# Patient Record
Sex: Female | Born: 1950 | Race: White | Hispanic: No | Smoking: Never smoker
Health system: Southern US, Community
[De-identification: ages and names within clinical notes are randomized; demographics above are authoritative.]

## PROBLEM LIST (undated history)

## (undated) ENCOUNTER — Emergency Department: Admission: EM | Payer: Medicare PPO

## (undated) DIAGNOSIS — E7800 Pure hypercholesterolemia, unspecified: Secondary | ICD-10-CM

## (undated) DIAGNOSIS — M797 Fibromyalgia: Secondary | ICD-10-CM

## (undated) DIAGNOSIS — M1990 Unspecified osteoarthritis, unspecified site: Secondary | ICD-10-CM

## (undated) DIAGNOSIS — G249 Dystonia, unspecified: Secondary | ICD-10-CM

## (undated) DIAGNOSIS — N289 Disorder of kidney and ureter, unspecified: Secondary | ICD-10-CM

## (undated) DIAGNOSIS — G248 Other dystonia: Secondary | ICD-10-CM

## (undated) DIAGNOSIS — T7840XA Allergy, unspecified, initial encounter: Secondary | ICD-10-CM

## (undated) DIAGNOSIS — G4089 Other seizures: Secondary | ICD-10-CM

## (undated) DIAGNOSIS — E079 Disorder of thyroid, unspecified: Secondary | ICD-10-CM

## (undated) DIAGNOSIS — Z8619 Personal history of other infectious and parasitic diseases: Secondary | ICD-10-CM

## (undated) HISTORY — DX: Dystonia, unspecified: G24.9

## (undated) HISTORY — DX: Disorder of kidney and ureter, unspecified: N28.9

## (undated) HISTORY — DX: Other dystonia: G24.8

## (undated) HISTORY — DX: Allergy, unspecified, initial encounter: T78.40XA

## (undated) HISTORY — DX: Disorder of thyroid, unspecified: E07.9

## (undated) HISTORY — DX: Personal history of other infectious and parasitic diseases: Z86.19

## (undated) HISTORY — PX: COLONOSCOPY: SHX174

## (undated) HISTORY — DX: Unspecified osteoarthritis, unspecified site: M19.90

## (undated) HISTORY — DX: Pure hypercholesterolemia, unspecified: E78.00

## (undated) HISTORY — DX: Fibromyalgia: M79.7

## (undated) HISTORY — DX: Other seizures: G40.89

---
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
## 1990-09-19 HISTORY — PX: LAPAROSCOPY: SHX197

## 1998-05-21 ENCOUNTER — Other Ambulatory Visit: Admission: RE | Admit: 1998-05-21 | Discharge: 1998-05-21 | Payer: Self-pay | Admitting: Obstetrics and Gynecology

## 1999-06-15 ENCOUNTER — Other Ambulatory Visit: Admission: RE | Admit: 1999-06-15 | Discharge: 1999-06-15 | Payer: Self-pay | Admitting: Obstetrics and Gynecology

## 2000-07-17 ENCOUNTER — Other Ambulatory Visit: Admission: RE | Admit: 2000-07-17 | Discharge: 2000-07-17 | Payer: Self-pay | Admitting: Obstetrics and Gynecology

## 2001-08-10 ENCOUNTER — Other Ambulatory Visit: Admission: RE | Admit: 2001-08-10 | Discharge: 2001-08-10 | Payer: Self-pay | Admitting: Obstetrics and Gynecology

## 2002-04-12 ENCOUNTER — Encounter: Payer: Self-pay | Admitting: Family Medicine

## 2002-04-12 ENCOUNTER — Ambulatory Visit (HOSPITAL_COMMUNITY): Admission: RE | Admit: 2002-04-12 | Discharge: 2002-04-12 | Payer: Self-pay | Admitting: Family Medicine

## 2002-08-21 ENCOUNTER — Other Ambulatory Visit: Admission: RE | Admit: 2002-08-21 | Discharge: 2002-08-21 | Payer: Self-pay | Admitting: Obstetrics and Gynecology

## 2003-08-25 ENCOUNTER — Other Ambulatory Visit: Admission: RE | Admit: 2003-08-25 | Discharge: 2003-08-25 | Payer: Self-pay | Admitting: Obstetrics and Gynecology

## 2004-11-01 ENCOUNTER — Other Ambulatory Visit: Admission: RE | Admit: 2004-11-01 | Discharge: 2004-11-01 | Payer: Self-pay | Admitting: Obstetrics and Gynecology

## 2004-11-18 ENCOUNTER — Encounter: Admission: RE | Admit: 2004-11-18 | Discharge: 2004-11-18 | Payer: Self-pay | Admitting: Obstetrics and Gynecology

## 2005-12-19 ENCOUNTER — Encounter: Admission: RE | Admit: 2005-12-19 | Discharge: 2005-12-19 | Payer: Self-pay | Admitting: Obstetrics and Gynecology

## 2006-05-16 ENCOUNTER — Other Ambulatory Visit: Admission: RE | Admit: 2006-05-16 | Discharge: 2006-05-16 | Payer: Self-pay | Admitting: Obstetrics and Gynecology

## 2007-01-03 ENCOUNTER — Encounter: Admission: RE | Admit: 2007-01-03 | Discharge: 2007-01-03 | Payer: Self-pay | Admitting: Family Medicine

## 2007-09-21 ENCOUNTER — Other Ambulatory Visit: Admission: RE | Admit: 2007-09-21 | Discharge: 2007-09-21 | Payer: Self-pay | Admitting: Obstetrics and Gynecology

## 2008-06-19 ENCOUNTER — Encounter: Admission: RE | Admit: 2008-06-19 | Discharge: 2008-06-19 | Payer: Self-pay | Admitting: Obstetrics and Gynecology

## 2009-08-05 ENCOUNTER — Encounter: Admission: RE | Admit: 2009-08-05 | Discharge: 2009-08-05 | Payer: Self-pay | Admitting: Family Medicine

## 2010-08-04 ENCOUNTER — Encounter: Admission: RE | Admit: 2010-08-04 | Discharge: 2010-08-04 | Payer: Self-pay | Admitting: Neurology

## 2010-09-27 ENCOUNTER — Encounter
Admission: RE | Admit: 2010-09-27 | Discharge: 2010-09-27 | Payer: Self-pay | Source: Home / Self Care | Attending: Family Medicine | Admitting: Family Medicine

## 2011-01-19 ENCOUNTER — Other Ambulatory Visit: Payer: Self-pay | Admitting: Neurology

## 2011-01-19 DIAGNOSIS — R269 Unspecified abnormalities of gait and mobility: Secondary | ICD-10-CM

## 2011-01-19 DIAGNOSIS — R259 Unspecified abnormal involuntary movements: Secondary | ICD-10-CM

## 2011-01-19 DIAGNOSIS — R9409 Abnormal results of other function studies of central nervous system: Secondary | ICD-10-CM

## 2011-01-25 ENCOUNTER — Ambulatory Visit
Admission: RE | Admit: 2011-01-25 | Discharge: 2011-01-25 | Disposition: A | Discharge status: Home / Self Care | Payer: BC Managed Care – PPO | Source: Ambulatory Visit | Attending: Neurology | Admitting: Neurology

## 2011-01-25 DIAGNOSIS — R259 Unspecified abnormal involuntary movements: Secondary | ICD-10-CM

## 2011-01-25 DIAGNOSIS — R9409 Abnormal results of other function studies of central nervous system: Secondary | ICD-10-CM

## 2011-01-25 DIAGNOSIS — R269 Unspecified abnormalities of gait and mobility: Secondary | ICD-10-CM

## 2011-10-17 ENCOUNTER — Other Ambulatory Visit: Payer: Self-pay | Admitting: Family Medicine

## 2011-10-17 DIAGNOSIS — Z1231 Encounter for screening mammogram for malignant neoplasm of breast: Secondary | ICD-10-CM

## 2011-12-05 ENCOUNTER — Ambulatory Visit
Admission: RE | Admit: 2011-12-05 | Discharge: 2011-12-05 | Disposition: A | Discharge status: Home / Self Care | Payer: BC Managed Care – PPO | Source: Ambulatory Visit | Attending: Family Medicine | Admitting: Family Medicine

## 2011-12-05 DIAGNOSIS — Z1231 Encounter for screening mammogram for malignant neoplasm of breast: Secondary | ICD-10-CM

## 2011-12-07 ENCOUNTER — Other Ambulatory Visit: Payer: Self-pay | Admitting: Family Medicine

## 2011-12-07 ENCOUNTER — Other Ambulatory Visit (HOSPITAL_COMMUNITY)
Admission: RE | Admit: 2011-12-07 | Discharge: 2011-12-07 | Disposition: A | Discharge status: Home / Self Care | Payer: BC Managed Care – PPO | Source: Ambulatory Visit | Attending: Family Medicine | Admitting: Family Medicine

## 2011-12-07 DIAGNOSIS — R928 Other abnormal and inconclusive findings on diagnostic imaging of breast: Secondary | ICD-10-CM

## 2011-12-07 DIAGNOSIS — Z0000 Encounter for general adult medical examination without abnormal findings: Secondary | ICD-10-CM | POA: Insufficient documentation

## 2011-12-13 ENCOUNTER — Ambulatory Visit
Admission: RE | Admit: 2011-12-13 | Discharge: 2011-12-13 | Disposition: A | Discharge status: Home / Self Care | Payer: BC Managed Care – PPO | Source: Ambulatory Visit | Attending: Family Medicine | Admitting: Family Medicine

## 2011-12-13 DIAGNOSIS — R928 Other abnormal and inconclusive findings on diagnostic imaging of breast: Secondary | ICD-10-CM

## 2012-07-04 DIAGNOSIS — R202 Paresthesia of skin: Secondary | ICD-10-CM | POA: Insufficient documentation

## 2012-07-04 DIAGNOSIS — G248 Other dystonia: Secondary | ICD-10-CM | POA: Insufficient documentation

## 2012-12-26 ENCOUNTER — Other Ambulatory Visit: Payer: Self-pay | Admitting: Family Medicine

## 2012-12-26 DIAGNOSIS — R928 Other abnormal and inconclusive findings on diagnostic imaging of breast: Secondary | ICD-10-CM

## 2013-01-08 ENCOUNTER — Ambulatory Visit
Admission: RE | Admit: 2013-01-08 | Discharge: 2013-01-08 | Disposition: A | Discharge status: Home / Self Care | Payer: Self-pay | Source: Ambulatory Visit | Attending: Family Medicine | Admitting: Family Medicine

## 2013-01-08 DIAGNOSIS — R928 Other abnormal and inconclusive findings on diagnostic imaging of breast: Secondary | ICD-10-CM

## 2013-10-18 ENCOUNTER — Encounter: Payer: Self-pay | Admitting: Internal Medicine

## 2013-12-04 ENCOUNTER — Other Ambulatory Visit: Payer: Self-pay

## 2013-12-04 DIAGNOSIS — Z1231 Encounter for screening mammogram for malignant neoplasm of breast: Secondary | ICD-10-CM

## 2013-12-10 ENCOUNTER — Other Ambulatory Visit: Payer: Self-pay | Admitting: Family Medicine

## 2013-12-10 ENCOUNTER — Encounter: Payer: Self-pay | Admitting: Neurology

## 2013-12-10 DIAGNOSIS — R52 Pain, unspecified: Secondary | ICD-10-CM

## 2013-12-13 ENCOUNTER — Ambulatory Visit
Admission: RE | Admit: 2013-12-13 | Discharge: 2013-12-13 | Disposition: A | Discharge status: Home / Self Care | Payer: BC Managed Care – PPO | Source: Ambulatory Visit | Attending: Family Medicine | Admitting: Family Medicine

## 2013-12-13 ENCOUNTER — Other Ambulatory Visit: Payer: BC Managed Care – PPO

## 2013-12-13 DIAGNOSIS — R52 Pain, unspecified: Secondary | ICD-10-CM

## 2013-12-19 ENCOUNTER — Ambulatory Visit (INDEPENDENT_AMBULATORY_CARE_PROVIDER_SITE_OTHER): Payer: BC Managed Care – PPO | Admitting: Neurology

## 2013-12-19 ENCOUNTER — Encounter: Payer: Self-pay | Admitting: Neurology

## 2013-12-19 VITALS — BP 110/72 | HR 76 | Resp 18 | Ht 64.5 in | Wt 125.0 lb

## 2013-12-19 DIAGNOSIS — M545 Low back pain, unspecified: Secondary | ICD-10-CM

## 2013-12-19 DIAGNOSIS — G249 Dystonia, unspecified: Secondary | ICD-10-CM

## 2013-12-19 DIAGNOSIS — R259 Unspecified abnormal involuntary movements: Secondary | ICD-10-CM

## 2013-12-19 DIAGNOSIS — R292 Abnormal reflex: Secondary | ICD-10-CM

## 2013-12-19 MED ORDER — BACLOFEN 10 MG PO TABS: 20.0000 mg | ORAL_TABLET | Freq: Three times a day (TID) | ORAL | Status: DC

## 2013-12-19 NOTE — Patient Instructions (Addendum)
1.  Baclofen - 10 mg - 1 tablet twice per day for a week, then 1 tablet three times per day for a week and then call me (we will likely need to increase the medication but I just want feedback on if you are having side effects) 2.  Let me know if you wish to talk with a patient representative who has had the dbs for dystonia and we will get you in touch with one. 3.  We have scheduled you at [facility] for your MRI on 01/07/14 at 10:00 am. Please arrive 15 minutes prior and go to 1st floor radiology. If you need to reschedule for any reason please call [phone].

## 2013-12-19 NOTE — Progress Notes (Signed)
Darlene Burns was seen today in neurologic consultation at the request of Allean Found, MD.    The patient presents today for evaluation of focal dystonia.  I reviewed a number of records that were made available to me.  The patient has seen multiple different neurologists including Dr. Anne Hahn, Dr. Terrace Arabia, as well as being seen by movement specialist at [facility] (Dr. Dan Humphreys).  The patient has a diagnosis of focal dystonia of the right ankle.  She also has a diagnosis of primary writing dystonia.  The patient has undergone many therapies for the right ankle dystonia including Botox, physical therapy, electrical stimulation (initially helpful but then seemed to lose efficacy), Sinemet, Artane and clonazepam.  She has also undergone several studies looking for the etiology.  She had an MRI of the brain in November, 2011 as well as May, 2012.  This revealed T2 hyperintensities, one of which was larger than one might expect just for small vessel disease.  This was therefore worked up for demyelinating disease as well as worked up for hypercoagulable states.  Anticardiolipin antibodies were negative, lupus anticoagulant was negative, Sjgren's antibody negative, factor V Leiden negative, rheumatoid factor negative, sedimentation rate to 1, B12 809, ANA negative, ACE was normal.  When I reviewed 2012 MRI brain it just looked like small vessel disease, however.  MRI of the cervical spine was completed on 08/02/2012 that was normal.  Visual evoked potentials were performed at [facility] in November, 2013 that were normal.  She had upper and lower extremity SSEPs that were normal.  Pt reports that the sx's started in may, 2011 and it started with the big toe curing under on the R.  It progressed with the course of time.  She originally went to a foot and ankle specialist and then was referred to [facility].  She had botox almost immediately with Dr. Terrace Arabia and had a bad experience.  She asked Dr. Anne Hahn for another  referral and then was referred to Dr. Dan Humphreys at [facility].  She had a personality conflict and went to [facility].  She liked the physician at [facility] but nothing seemed to help on the long term.  She has been under a significant amount of personal stress.  Her husband has been declared incompetant (has Korsakoff encephalopathy and was in jail) and is now in [place] and she is the one taking care of his affairs and is very stressed.  She is not seeing anyone to help with the stress as she thinks that to go is just "one more stress."  She got a cat 2 weeks ago and some of that has helped but he has bitten her as well.    She really is having trouble walking.  When she puts the put the R foot down, the toes curl.  Recently, she notes that the other foot has paresthesias and are burning.  She lives alone.  She worries that someday, she will not be able to walk at all.    PREVIOUS MEDICATIONS: klonopin, sinemet, artane  ALLERGIES:   Allergies  Allergen Reactions  . Amoxicillin Diarrhea    Profuse diarrhea  . Sulfa Antibiotics Rash    CURRENT MEDICATIONS:  Current Outpatient Prescriptions on File Prior to Visit  Medication Sig Dispense Refill  . calcium citrate-vitamin D (CITRACAL+D) 315-200 MG-UNIT per tablet Take 1 tablet by mouth 2 (two) times daily.      Marland Kitchen levothyroxine (SYNTHROID, LEVOTHROID) 50 MCG tablet Take 50 mcg by mouth daily before breakfast.       .  Multiple Vitamins tablet Take 1 tablet by mouth daily.       . simvastatin (ZOCOR) 40 MG tablet Take 40 mg by mouth daily at 6 PM.       . traZODone (DESYREL) 100 MG tablet Take 100 mg by mouth at bedtime.        No current facility-administered medications on file prior to visit.    PAST MEDICAL HISTORY:   Past Medical History  Diagnosis Date  . Thyroid disorder   . Hypercholesteremia   . Dystonia   . Fibromyalgia   . History of shingles     PAST SURGICAL HISTORY:   Past Surgical History  Procedure Laterality Date  . Laparoscopy  1992   . Tonsillectomy  1956    SOCIAL HISTORY:   History   Social History  . Marital Status: Married    Spouse Name: N/A    Number of Children: N/A  . Years of Education: N/A   Occupational History  . Not on file.   Social History Main Topics  . Smoking status: Never Smoker   . Smokeless tobacco: Not on file  . Alcohol Use: No  . Drug Use: No  . Sexual Activity: Not on file   Other Topics Concern  . Not on file   Social History Narrative  . No narrative on file    FAMILY HISTORY:   Family Status  Relation Status Death Age  . Father Deceased 69    lung cancer, diabetes, MI, CAD  . Mother Deceased 60    brain tumor, hyperlipidemia  . Sister Alive     healthy    ROS:  Some blurry vision.  A complete 10 system review of systems was obtained and was unremarkable apart from what is mentioned above.  PHYSICAL EXAMINATION:    VITALS:   Filed Vitals:   12/19/13 0901  BP: 110/72  Pulse: 76  Resp: 18  Height: 5' 4.5" (1.638 m)  Weight: 125 lb (56.7 kg)    GEN:  Normal appears female in no acute distress.  Appears stated age. HEENT:  Normocephalic, atraumatic. The mucous membranes are moist. The superficial temporal arteries are without ropiness or tenderness. Cardiovascular: Regular rate and rhythm. Lungs: Clear to auscultation bilaterally. Neck/Heme: There are no carotid bruits noted bilaterally.  NEUROLOGICAL: Orientation:  The patient is alert and oriented x 3.  Fund of knowledge is appropriate.  Recent and remote memory intact.  Attention span and concentration normal.  Repeats and names without difficulty. Cranial nerves: There is good facial symmetry. The pupils are equal round and reactive to light bilaterally. Fundoscopic exam reveals clear disc margins bilaterally. Extraocular muscles are intact and visual fields are full to confrontational testing. Speech is fluent and clear. Soft palate rises symmetrically and there is no tongue deviation. Hearing is intact  to conversational tone. Tone: Tone is good throughout. Sensation: Sensation is intact to light touch and pinprick throughout (facial, trunk, extremities). Vibration is intact at the bilateral big toe. There is no extinction with double simultaneous stimulation. There is no sensory dermatomal level identified. Coordination:  The patient has no difficulty with RAM's or FNF bilaterally. Motor: Strength is 5/5 in the bilateral upper and lower extremities.  Shoulder shrug is equal and symmetric. There is no pronator drift.  There are no fasciculations noted.  The toes are curled on the R foot and the ankle is inverted DTR's: Deep tendon reflexes are 3-/4 at the bilateral biceps, triceps, brachioradialis, patella and achilles.  Plantar  responses are downgoing bilaterally. Gait and Station: The patient is able to ambulate.  She does have a steppage gait on the R and does not have a heel strike.     IMPRESSION/PLAN  1. R foot dystonia.  -She has undergone a number of treatments and extensive workup.  Given the hyperreflexia, I would like to perform an MRI of the lumbar spine.  She was agreeable.  -We talked about other medications, although medications options are limited.  There is some evidence for Dilantin, Tegretol and baclofen.  We ultimately decided on baclofen.  She will begin with 10 mg twice a day, working up to 10 mg 3 times a day.  Ultimately, the goal is to get up to 20 mg 3 times a day, without causing side effects.  Risks, benefits, side effects and alternative therapies were discussed.  The opportunity to ask questions was given and they were answered to the best of my ability.  The patient expressed understanding and willingness to follow the outlined treatment protocols.  -We talked extensively about DBS for dystonia.  We talked about the risks and benefits.  We talked about the fact that results are not immediate, and  can take months to even a year to show benefit.  She was under the  impression that if she had the surgery, the effects would be immediate and she would walk out of the hospital a new person.  She read an article that a nurse had written that talked about that.  I told her that was not factual for the treatment of dystonia.  I did give her a patient education DVD on dystonia.  I offered to have her talk to a patient representative who has had the surgery.  She is going to think about that.  -She asked me about a denervation surgery and I was not in favor.  She stated that her [facility] physician told her the same thing.  -Greater than 50% of 80 min visit in counseling discussing tx options, safety, coordinating care.  Does not include time for record review which was extensive.    Thank you for your referral.  Please feel free to call me with questions or concerns.

## 2013-12-26 ENCOUNTER — Telehealth: Payer: Self-pay | Admitting: Neurology

## 2013-12-26 NOTE — Telephone Encounter (Signed)
Has some questions and needs to talk to someone [phone]

## 2013-12-26 NOTE — Telephone Encounter (Signed)
Patient wanted to correct some mistakes in her chart. She only takes 1/2 tablet of Trazodone at night and she started an aspirin 81 mg daily. These medication changes were corrected. Patient also questioned why her primary diagnosis for her visit was back pain when she was primarily seen for dystonia. Made aware it was probably typed first and associated with the reason for her MR evaluation. Patient made aware dystonia is on her visit diagnosis. She will call with any other questions.

## 2014-01-07 ENCOUNTER — Ambulatory Visit (HOSPITAL_COMMUNITY)
Admission: RE | Admit: 2014-01-07 | Discharge: 2014-01-07 | Disposition: A | Discharge status: Home / Self Care | Payer: BC Managed Care – PPO | Source: Ambulatory Visit | Attending: Neurology | Admitting: Neurology

## 2014-01-07 DIAGNOSIS — M51379 Other intervertebral disc degeneration, lumbosacral region without mention of lumbar back pain or lower extremity pain: Secondary | ICD-10-CM | POA: Insufficient documentation

## 2014-01-07 DIAGNOSIS — M545 Low back pain, unspecified: Secondary | ICD-10-CM

## 2014-01-07 DIAGNOSIS — M5126 Other intervertebral disc displacement, lumbar region: Secondary | ICD-10-CM | POA: Insufficient documentation

## 2014-01-07 DIAGNOSIS — G249 Dystonia, unspecified: Secondary | ICD-10-CM

## 2014-01-07 DIAGNOSIS — M5137 Other intervertebral disc degeneration, lumbosacral region: Secondary | ICD-10-CM | POA: Insufficient documentation

## 2014-01-07 DIAGNOSIS — R209 Unspecified disturbances of skin sensation: Secondary | ICD-10-CM | POA: Insufficient documentation

## 2014-01-07 DIAGNOSIS — R262 Difficulty in walking, not elsewhere classified: Secondary | ICD-10-CM | POA: Insufficient documentation

## 2014-01-09 ENCOUNTER — Encounter (INDEPENDENT_AMBULATORY_CARE_PROVIDER_SITE_OTHER): Payer: Self-pay

## 2014-01-09 ENCOUNTER — Ambulatory Visit
Admission: RE | Admit: 2014-01-09 | Discharge: 2014-01-09 | Disposition: A | Discharge status: Home / Self Care | Payer: BC Managed Care – PPO | Source: Ambulatory Visit

## 2014-01-09 DIAGNOSIS — Z1231 Encounter for screening mammogram for malignant neoplasm of breast: Secondary | ICD-10-CM

## 2014-01-10 ENCOUNTER — Telehealth: Payer: Self-pay | Admitting: Neurology

## 2014-01-10 NOTE — Telephone Encounter (Signed)
Spoke with patient and made her aware no findings for the cause for dystonia. Patient requested a copy be mailed to her and that has been done. She has been on the baclofen for two weeks. She is currently taking 10 mg TID. She states she feels worse. She is having more trouble walking and having more pain. Please advise.

## 2014-01-10 NOTE — Telephone Encounter (Signed)
She can stop it if it is making things worse.  I did not get the feeling that she was interested in DBS therapy given the length of time it can take to work.  Is that correct?

## 2014-01-10 NOTE — Telephone Encounter (Signed)
Message copied by Silvio PateMCCRACKEN, JADE L on Fri Jan 10, 2014  8:07 AM ------      Message from: TAT, REBECCA S      Created: Thu Jan 09, 2014  4:36 PM       Yes, degenerative changes but nothing causing the dystonia      ----- Message -----         From: Silvio PateJade L McCracken, CMA         Sent: 01/09/2014   9:56 AM           To: Octaviano Battyebecca S Tat, DO            Have you looked at her MR yet?       ------

## 2014-01-10 NOTE — Telephone Encounter (Signed)
Spoke with patient and she will stop Baclofen. She wants to know if we have any other medication suggestions. She is interested in DBS, but feels like she needs more information. She would be interested in speaking with a patient who has had it before - she states she had spoken with you about this at her visit. Please advise.

## 2014-01-10 NOTE — Telephone Encounter (Signed)
Spoke with patient and she will hold off on the other medications for now. I spoke with Caryn BeeKevin who will look into a patient rep for dystonia and get back to me.

## 2014-01-10 NOTE — Telephone Encounter (Signed)
There is some limited evidence that some of the older seizure meds can help dystonia.  Let me know if she wants to try.  Ask kevin or dusty to hook patient up with a pt rep for DBS re: dystonia.

## 2014-03-20 ENCOUNTER — Encounter: Payer: Self-pay | Admitting: Neurology

## 2014-03-20 ENCOUNTER — Ambulatory Visit (INDEPENDENT_AMBULATORY_CARE_PROVIDER_SITE_OTHER): Payer: BC Managed Care – PPO | Admitting: Neurology

## 2014-03-20 ENCOUNTER — Telehealth: Payer: Self-pay | Admitting: Neurology

## 2014-03-20 VITALS — BP 100/72 | HR 79 | Ht 64.5 in | Wt 126.0 lb

## 2014-03-20 DIAGNOSIS — G2589 Other specified extrapyramidal and movement disorders: Secondary | ICD-10-CM

## 2014-03-20 DIAGNOSIS — R259 Unspecified abnormal involuntary movements: Secondary | ICD-10-CM

## 2014-03-20 DIAGNOSIS — G248 Other dystonia: Secondary | ICD-10-CM

## 2014-03-20 DIAGNOSIS — G249 Dystonia, unspecified: Secondary | ICD-10-CM

## 2014-03-20 DIAGNOSIS — Z5181 Encounter for therapeutic drug level monitoring: Secondary | ICD-10-CM

## 2014-03-20 DIAGNOSIS — H532 Diplopia: Secondary | ICD-10-CM

## 2014-03-20 MED ORDER — OXCARBAZEPINE 150 MG PO TABS: ORAL_TABLET | ORAL | Status: DC

## 2014-03-20 NOTE — Telephone Encounter (Signed)
Message copied by Silvio PateMCCRACKEN, Lani Havlik L on Thu Mar 20, 2014  3:05 PM ------      Message from: TAT, REBECCA S      Created: Thu Mar 20, 2014 12:10 PM       Please call kevin. Pt wants to talk with pt rep who specifically has had DBS for a focal, not generalized, dystonia.  Is that an option that he can help with ? ------

## 2014-03-20 NOTE — Patient Instructions (Signed)
1. Your medication has been sent to your pharmacy. Trileptal 150 mg tablets. Please take one tablet by mouth twice daily for once week and then two tablets twice daily after. 2. Your provider has requested that you have labwork completed. [facility] on the first floor of this building in approximately two weeks. 3. We have sent a referral to Dr Raquel SarnaNina Browner at [facility]. They will contact you directly with an appt. If you do not hear from them they can be reached at [phone]. 4. Follow up after your appt with Dr Seward MethBrowner.

## 2014-03-20 NOTE — Progress Notes (Signed)
Darlene Burns was seen today in neurologic consultation at the request of Allean FoundSMITH,CANDACE THIELE, MD.    The patient presents today for evaluation of focal dystonia.  I reviewed a number of records that were made available to me.  The patient has seen multiple different neurologists including Dr. Anne HahnWillis, Dr. Terrace ArabiaYan, as well as being seen by movement specialist at [facility] (Dr. Dan HumphreysWalker).  The patient has a diagnosis of focal dystonia of the right ankle.  She also has a diagnosis of primary writing dystonia.  The patient has undergone many therapies for the right ankle dystonia including Botox, physical therapy, electrical stimulation (initially helpful but then seemed to lose efficacy), Sinemet, Artane and clonazepam.  She has also undergone several studies looking for the etiology.  She had an MRI of the brain in November, 2011 as well as May, 2012.  This revealed T2 hyperintensities, one of which was larger than one might expect just for small vessel disease.  This was therefore worked up for demyelinating disease as well as worked up for hypercoagulable states.  Anticardiolipin antibodies were negative, lupus anticoagulant was negative, Sjgren's antibody negative, factor V Leiden negative, rheumatoid factor negative, sedimentation rate to 1, B12 809, ANA negative, ACE was normal.  When I reviewed 2012 MRI brain it just looked like small vessel disease, however.  MRI of the cervical spine was completed on 08/02/2012 that was normal.  [facility] were [facility] at [facility] in November, 2013 that were normal.  She had upper and lower extremity SSEPs that were normal.  Pt reports that the sx's started in may, 2011 and it started with the big toe curing under on the R.  It progressed with the course of time.  She originally went to a foot and ankle specialist and then was referred to [facility].  She had botox almost immediately with Dr. Terrace ArabiaYan and had a bad experience.  She asked Dr. Anne HahnWillis for another  referral and then was referred to [facility].  She had a personality conflict and went to [facility].  She liked the physician at [facility] but nothing seemed to help on the long term.  She has been under a significant amount of personal stress.  Her husband has been declared incompetant (has Korsakoff encephalopathy and was in jail) and is now in [place] and she is the one taking care of his affairs and is very stressed.  She is not seeing anyone to help with the stress as she thinks that to go is just "one more stress."  She got a cat 2 weeks ago and some of that has helped but he has bitten her as well.    She really is having trouble walking.  When she puts the put the R foot down, the toes curl.  Recently, she notes that the other foot has paresthesias and are burning.  She lives alone.  She worries that someday, she will not be able to walk at all.    03/20/14 update:  The patient is following up today regarding her right foot dystonia.  Last visit, I started her on baclofen.  She got up to 10 mg twice a day and called and said that it made her worse.  She did not want to titrate up further.  She did not want to try any other medications at the time.  She was given the patient DVT on dystonia.  She did request to talk to a patient representative who has had  DBS for dystonia.  I talked to the [employer] rep and requested that he put her in contact with such a patient, but unfortunately this was not done.  She has read some literature on DBS therapy and has looked into some trials, but is frustrated because there are no trials related to focal dystonia, but rather more generalized dystonia.  She is willing to try other medications.  She brings in a calendar today and shows me a few days where she actually had the ability to walk in the morning for 20 or 30 minutes.  She states that if she walks barefoot it is better, but the minute she tries to put on a shoe, she will have a dystonic posturing of the foot.   Today, she complains about something new.  She complains about diplopia.  She states that it has been intermittently going on for about 2 years.  It is vertical in nature.  She is unsure if it is monocular.  It seems worse in the morning, but she is not sure of that.  There is no ptosis.  She has seen the eye doctor, but no diagnosis was given.  No shortness of breath.  No difficulty climbing stairs, with the exception of the dystonic posturing of the foot.  The patient did have an MRI of the lumbar spine since last visit because of hyperreflexia and because of the foot dystonia.  There were some degenerative changes and facet hypertrophy at several levels, but no evidence of high-grade stenosis or anything that would contribute to this dystonia.  PREVIOUS MEDICATIONS: klonopin, sinemet, artane  ALLERGIES:   Allergies  Allergen Reactions  . Amoxicillin Diarrhea    Profuse diarrhea  . Sulfa Antibiotics Rash    CURRENT MEDICATIONS:  Current Outpatient Prescriptions on File Prior to Visit  Medication Sig Dispense Refill  . aspirin 81 MG tablet Take 81 mg by mouth daily.      . calcium citrate-vitamin D (CITRACAL+D) 315-200 MG-UNIT per tablet Take 1 tablet by mouth 2 (two) times daily.      Marland Kitchen. levothyroxine (SYNTHROID, LEVOTHROID) 50 MCG tablet Take 50 mcg by mouth daily before breakfast.       . Multiple Vitamins tablet Take 1 tablet by mouth daily.       . simvastatin (ZOCOR) 40 MG tablet Take 40 mg by mouth daily at 6 PM.       . traZODone (DESYREL) 100 MG tablet Take 50 mg by mouth at bedtime.        No current facility-administered medications on file prior to visit.    PAST MEDICAL HISTORY:   Past Medical History  Diagnosis Date  . Thyroid disorder   . Hypercholesteremia   . Dystonia   . Fibromyalgia   . History of shingles     PAST SURGICAL HISTORY:   Past Surgical History  Procedure Laterality Date  . Laparoscopy  1992  . Tonsillectomy  1956    SOCIAL HISTORY:     History   Social History  . Marital Status: Married    Spouse Name: N/A    Number of Children: N/A  . Years of Education: N/A   Occupational History  .      school librarian - retired   Social History Main Topics  . Smoking status: Never Smoker   . Smokeless tobacco: Not on file  . Alcohol Use: No  . Drug Use: No  . Sexual Activity: Not on file   Other Topics Concern  .  Not on file   Social History Narrative  . No narrative on file    FAMILY HISTORY:   Family Status  Relation Status Death Age  . Father Deceased 62    lung cancer, diabetes, MI, CAD  . Mother Deceased 45    ? meningioma, hyperlipidemia  . Sister Alive     healthy    ROS:   A complete 10 system review of systems was obtained and was unremarkable apart from what is mentioned above.  PHYSICAL EXAMINATION:    VITALS:   Filed Vitals:   03/20/14 0830  BP: 100/72  Pulse: 79  Height: 5' 4.5" (1.638 m)  Weight: 126 lb (57.153 kg)    GEN:  Normal appears female in no acute distress.  Appears stated age. HEENT:  Normocephalic, atraumatic. The mucous membranes are moist. The superficial temporal arteries are without ropiness or tenderness. Cardiovascular: Regular rate and rhythm. Lungs: Clear to auscultation bilaterally. Neck/Heme: There are no carotid bruits noted bilaterally.  NEUROLOGICAL: Orientation:  The patient is alert and oriented x 3.  Fund of knowledge is appropriate.  Recent and remote memory intact.  Attention span and concentration normal.  Repeats and names without difficulty. Cranial nerves: There is good facial symmetry. The pupils are equal round and reactive to light bilaterally. Fundoscopic exam reveals clear disc margins bilaterally. Extraocular muscles are intact and visual fields are full to confrontational testing. Speech is fluent and clear. Soft palate rises symmetrically and there is no tongue deviation. Hearing is intact to conversational tone. Tone: Tone is good  throughout. Sensation: Sensation is intact to light touch throughout. Coordination:  The patient has no difficulty with RAM's or FNF bilaterally. Motor: Strength is 5/5 in the bilateral upper and lower extremities.  Shoulder shrug is equal and symmetric. There is no pronator drift.  There are no fasciculations noted.  The toes are curled on the R foot and the ankle is inverted DTR's: Deep tendon reflexes are 3-/4 at the bilateral biceps, triceps, brachioradialis, patella and achilles.  Plantar responses are downgoing bilaterally. Gait and Station: The patient is able to ambulate.  She does have a steppage gait on the R and does not have a heel strike.  She is wearing an AFO on the right.   IMPRESSION/PLAN  1. R foot dystonia.  -She has undergone a number of treatments and extensive workup.    -We talked about other medications, although medications options are limited.  There is some evidence for Dilantin, Tegretol.  We decided to try Trileptal.  Risks, benefits, side effects and alternative therapies were discussed.  The opportunity to ask questions was given and they were answered to the best of my ability.  The patient expressed understanding and willingness to follow the outlined treatment protocols.  She will have a BMP in 2 weeks.  -She would like an opinion with Dr. Seward Meth, and I will refer her.  -Will try to put her in touch with a patient representative that has had DBS for dystonia, at her request. 2.  diplopia, fluctuating intermittent.  -We'll draw acetylcholine receptor antibodies.

## 2014-03-20 NOTE — Telephone Encounter (Signed)
Spoke with Caryn BeeKevin. He will look into it and get back to me on Monday.

## 2014-03-24 NOTE — Telephone Encounter (Signed)
Caryn BeeKevin sent me an information packet with contact information for DBS representatives. Mailed to patient.

## 2014-04-01 ENCOUNTER — Telehealth: Payer: Self-pay | Admitting: Neurology

## 2014-04-01 NOTE — Telephone Encounter (Signed)
Spoke with [facility], Dr Estrellita LudwigNina Browner's office and they received referral - it is still in review and they will call patient to schedule once this has been approved.

## 2014-04-08 LAB — BASIC METABOLIC PANEL
BUN: 16 mg/dL (ref 6–23)
CHLORIDE: 102 meq/L (ref 96–112)
CO2: 30 mEq/L (ref 19–32)
Calcium: 9.5 mg/dL (ref 8.4–10.5)
Creat: 0.75 mg/dL (ref 0.50–1.10)
GLUCOSE: 84 mg/dL (ref 70–99)
POTASSIUM: 4.7 meq/L (ref 3.5–5.3)
SODIUM: 140 meq/L (ref 135–145)

## 2014-04-13 LAB — MYASTHENIA GRAVIS PANEL 2: Acetylcholine Rec Mod Ab: 11 %

## 2014-04-14 ENCOUNTER — Encounter: Payer: Self-pay | Admitting: Neurology

## 2014-04-16 ENCOUNTER — Telehealth: Payer: Self-pay | Admitting: Neurology

## 2014-04-16 MED ORDER — OXCARBAZEPINE 150 MG PO TABS
ORAL_TABLET | ORAL | Status: DC
Start: 2014-04-16 — End: 2016-02-24

## 2014-04-16 NOTE — Telephone Encounter (Signed)
Patient made aware of Dr Don Perkingat's message. Labs normal. She will increase Trileptal to 600 mg in the AM 300 mg at night. New RX sent to pharmacy. She does not have an appt with [facility] yet- she has to call early morning on 05/01/2014 to get appt.

## 2014-04-16 NOTE — Telephone Encounter (Signed)
Pt called for lab results, this apparently did not go through. She has recently checked MyChart and did not see a message from Dr. Arbutus Leasat. Also has a question about continuing meds. CB# [phone] / Sherri S>

## 2014-04-21 ENCOUNTER — Telehealth: Payer: Self-pay | Admitting: Neurology

## 2014-04-21 NOTE — Telephone Encounter (Signed)
Patient made aware. She will let me know how she does with this change.

## 2014-04-21 NOTE — Telephone Encounter (Signed)
Patient increased Trileptal to 600 mg in the AM, 300 mg in the PM. She states for the past two days after taking 600 mg she gets very dizzy for about 3 1/5 hours. She wants to know if she should stick this out and her body will get used to it or whether she should decrease dosage again? She took only 450 mg this AM and she states she is not nearly as dizzy. Please advise.

## 2014-04-21 NOTE — Telephone Encounter (Signed)
Pt called stating that she is having side effects such as dizziness from taking TRILEPTAL 150mg . She wants to know if she needs to stop taking it all together or reduce the dosage.  C/B  [phone]

## 2014-04-21 NOTE — Telephone Encounter (Signed)
She can try Trileptal 450mg  in the AM and 300mg  in the PM x 1 week, then try to increase to 600mg  in AM and continue 300mg  in evening, to see if that helps with dizziness.  Maille Halliwell K. Allena KatzPatel, DO

## 2014-05-02 ENCOUNTER — Telehealth: Payer: Self-pay | Admitting: Neurology

## 2014-05-02 NOTE — Telephone Encounter (Signed)
If its causing this many problems and not helping sx's then just stop the medication.  I [facility] that [facility] with Dr. Seward MethBrowner.

## 2014-05-02 NOTE — Telephone Encounter (Signed)
Pt is taking medication and she states that she feels like she is about to pass out please call [phone]

## 2014-05-02 NOTE — Telephone Encounter (Signed)
Called patient back about Trileptal. She went down to 450 mg in the AM, 300 mg PM for one week and then increased back to 600 AM, 300 PM. She states she feels very sleepy and dizzy. She felt this somewhat with the lower dose but it is worse on the 600 mg AM dose. This morning when she took the medication she felt like she was going to pass out for 10 minutes and almost called 911. She is okay now, just sleepy and dizzy. Please advise.

## 2014-05-02 NOTE — Telephone Encounter (Signed)
Patient made aware to stop medication. She will decrease to 300 AM, 300 PM for 2 days and then stop. Her appt with Dr Seward MethBrowner is at the beginning of September. She will keep appt.

## 2014-05-12 ENCOUNTER — Encounter: Payer: Self-pay | Admitting: Neurology

## 2014-06-16 NOTE — Telephone Encounter (Signed)
error 

## 2014-07-03 ENCOUNTER — Encounter: Payer: Self-pay | Admitting: Internal Medicine

## 2014-11-19 ENCOUNTER — Other Ambulatory Visit: Payer: Self-pay | Admitting: Family Medicine

## 2014-11-19 DIAGNOSIS — E041 Nontoxic single thyroid nodule: Secondary | ICD-10-CM

## 2014-11-26 ENCOUNTER — Ambulatory Visit
Admission: RE | Admit: 2014-11-26 | Discharge: 2014-11-26 | Disposition: A | Discharge status: Home / Self Care | Payer: BC Managed Care – PPO | Source: Ambulatory Visit | Attending: Family Medicine | Admitting: Family Medicine

## 2014-11-26 DIAGNOSIS — E041 Nontoxic single thyroid nodule: Secondary | ICD-10-CM

## 2015-01-15 ENCOUNTER — Other Ambulatory Visit: Payer: Self-pay

## 2015-01-15 DIAGNOSIS — Z1231 Encounter for screening mammogram for malignant neoplasm of breast: Secondary | ICD-10-CM

## 2015-01-23 ENCOUNTER — Ambulatory Visit
Admission: RE | Admit: 2015-01-23 | Discharge: 2015-01-23 | Disposition: A | Discharge status: Home / Self Care | Payer: BC Managed Care – PPO | Source: Ambulatory Visit

## 2015-01-23 DIAGNOSIS — Z1231 Encounter for screening mammogram for malignant neoplasm of breast: Secondary | ICD-10-CM

## 2015-11-30 ENCOUNTER — Other Ambulatory Visit: Payer: Self-pay | Admitting: Family Medicine

## 2015-11-30 ENCOUNTER — Other Ambulatory Visit (HOSPITAL_COMMUNITY)
Admission: RE | Admit: 2015-11-30 | Discharge: 2015-11-30 | Disposition: A | Discharge status: Home / Self Care | Payer: Medicare Other | Source: Ambulatory Visit | Attending: Family Medicine | Admitting: Family Medicine

## 2015-11-30 DIAGNOSIS — Z124 Encounter for screening for malignant neoplasm of cervix: Secondary | ICD-10-CM | POA: Insufficient documentation

## 2015-12-01 LAB — CYTOLOGY - PAP

## 2015-12-24 ENCOUNTER — Other Ambulatory Visit: Payer: Self-pay

## 2015-12-24 DIAGNOSIS — Z1231 Encounter for screening mammogram for malignant neoplasm of breast: Secondary | ICD-10-CM

## 2016-01-11 ENCOUNTER — Encounter: Payer: Self-pay | Admitting: Gastroenterology

## 2016-01-27 ENCOUNTER — Ambulatory Visit
Admission: RE | Admit: 2016-01-27 | Discharge: 2016-01-27 | Disposition: A | Discharge status: Home / Self Care | Payer: Medicare Other | Source: Ambulatory Visit

## 2016-01-27 DIAGNOSIS — Z1231 Encounter for screening mammogram for malignant neoplasm of breast: Secondary | ICD-10-CM

## 2016-02-08 ENCOUNTER — Encounter: Payer: Self-pay | Admitting: Podiatry

## 2016-02-08 ENCOUNTER — Ambulatory Visit (INDEPENDENT_AMBULATORY_CARE_PROVIDER_SITE_OTHER): Payer: Medicare Other | Admitting: Podiatry

## 2016-02-08 VITALS — BP 134/86 | HR 80 | Resp 18

## 2016-02-08 DIAGNOSIS — G248 Other dystonia: Secondary | ICD-10-CM | POA: Diagnosis not present

## 2016-02-08 DIAGNOSIS — M2450 Contracture, unspecified joint: Secondary | ICD-10-CM

## 2016-02-08 DIAGNOSIS — R269 Unspecified abnormalities of gait and mobility: Secondary | ICD-10-CM

## 2016-02-08 NOTE — Progress Notes (Signed)
   Subjective:    Patient ID: Darlene Burns, female    DOB: 1951-03-23, 65 y.o.   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 She states that she has had multiple issues with her right foot and leg this started. She has seen multiple physicians including [facility] at [facility], [facility], [facility] as well as neurology a [facility]. She's tried therapy but she has not tried bracing. She has had Botox injections to her right leg which helps some. She is also tried [facility] as well as physical therapy. She's had multiple treatments without much significant improvement.  Review of Systems  All other systems reviewed and are negative.      Objective:   Physical Exam General: AAO x3, NAD  Dermatological: Mild incurvation of the right hallux toenail without any surrounding redness or drainage. No tenderness. No open lesions.  Vascular: Dorsalis Pedis artery and Posterior Tibial artery pedal pulses are 2/4 bilateral with immedate capillary fill time. Pedal hair growth present. No varicosities and no lower extremity edema present bilateral. There is no pain with calf compression, swelling, warmth, erythema.   Neruologic: Hyperreflexia of the right Achilles reflex. Sensation appears to be intact with Dorann OuSimms Weinstein monofilament  Musculoskeletal: Muscle contractures present on the right foot and hallux is a plantar flexed position. Upon gait evaluation of the forefoot is adducted with a layer foot varus and her leg and the genu varum on the right side. She gave me a diagram today of where she does get pain appears to be the right hallux, right lateral foot/rear foot. No area pinpoint tenderness at this time. The majority of her symptoms appear to be with ambulation.  Gait: Unassisted, Nonantalgic.     Assessment & Plan:  65 year old female with right focal dystonia, gait changes as a result. -Treatment options discussed including all  alternatives, risks, and complications -Etiology of symptoms were discussed -I long discussion the patient regard to treatment options. At this point she has seen multiple physicians for this as well as physical therapy, e-stim, Botox injections. I discussed with her bracing to see if this will help but she has not yet done this. Concerned that given her distally this could cause irritation and the brace we may be other do a brace for supportive but with good cushion to the area to help prevent this. [facility] to see if she would even be a good candidate for a brace.  -I discussed partial nail avulsion but she wishes to hold off on this. Discussed nail trimming techniques. Monitor for signs or symptoms of infection. He is going to a wedding in a couple weeks and she wishes to hold off for now.  Ovid CurdMatthew Wagoner, DPM

## 2016-02-22 ENCOUNTER — Ambulatory Visit: Payer: Medicare Other | Admitting: *Deleted

## 2016-02-22 DIAGNOSIS — R269 Unspecified abnormalities of gait and mobility: Secondary | ICD-10-CM

## 2016-02-22 NOTE — Progress Notes (Signed)
Patient ID: Darlene Burns, female   DOB: 02/25/1951, 64 y.o.   MRN: [id number] Patient presents to be casted for a brace with [facility] Certified Pedorthist.  Patient will return in 4 weeks to be fitted. 

## 2016-02-24 ENCOUNTER — Ambulatory Visit (AMBULATORY_SURGERY_CENTER): Payer: Self-pay | Admitting: *Deleted

## 2016-02-24 VITALS — Ht 63.5 in | Wt 131.0 lb

## 2016-02-24 DIAGNOSIS — Z1211 Encounter for screening for malignant neoplasm of colon: Secondary | ICD-10-CM

## 2016-02-24 NOTE — Progress Notes (Signed)
No egg or soy allergy known to patient  No issues with past sedation with any surgeries  or procedures, no intubation problems  No diet pills per patient No home 02 use per patient  No blood thinners per patient  Pt denies issues with constipation  emmi video to e mail  

## 2016-02-25 ENCOUNTER — Encounter: Payer: Self-pay | Admitting: Gastroenterology

## 2016-03-09 ENCOUNTER — Ambulatory Visit (AMBULATORY_SURGERY_CENTER): Payer: Medicare Other | Admitting: Gastroenterology

## 2016-03-09 ENCOUNTER — Encounter: Payer: Self-pay | Admitting: Gastroenterology

## 2016-03-09 VITALS — BP 124/73 | HR 72 | Temp 98.6°F | Resp 12 | Ht 63.5 in | Wt 131.0 lb

## 2016-03-09 DIAGNOSIS — Z1211 Encounter for screening for malignant neoplasm of colon: Secondary | ICD-10-CM | POA: Diagnosis present

## 2016-03-09 MED ORDER — SODIUM CHLORIDE 0.9 % IV SOLN: 500.0000 mL | INTRAVENOUS | Status: DC

## 2016-03-09 NOTE — Progress Notes (Signed)
To recovery, report to Ennis, RN, VSS 

## 2016-03-09 NOTE — Patient Instructions (Signed)
Impression/Recommendations:  Diverticulosis (handout given) High Fiber Diet (handout given)  YOU HAD AN ENDOSCOPIC PROCEDURE TODAY AT THE [facility]:   Refer to the procedure report that was given to you for any specific questions about what was found during the examination.  If the procedure report does not answer your questions, please call your gastroenterologist to clarify.  If you requested that your care partner not be given the details of your procedure findings, then the procedure report has been included in a sealed envelope for you to review at your convenience later.  YOU SHOULD EXPECT: Some feelings of bloating in the abdomen. Passage of more gas than usual.  Walking can help get rid of the air that was put into your GI tract during the procedure and reduce the bloating. If you had a lower endoscopy (such as a colonoscopy or flexible sigmoidoscopy) you may notice spotting of blood in your stool or on the toilet paper. If you underwent a bowel prep for your procedure, you may not have a normal bowel movement for a few days.  Please Note:  You might notice some irritation and congestion in your nose or some drainage.  This is from the oxygen used during your procedure.  There is no need for concern and it should clear up in a day or so.  SYMPTOMS TO REPORT IMMEDIATELY:   Following lower endoscopy (colonoscopy or flexible sigmoidoscopy):  Excessive amounts of blood in the stool  Significant tenderness or worsening of abdominal pains  Swelling of the abdomen that is new, acute  Fever of 100F or higher   For urgent or emergent issues, a gastroenterologist can be reached at any hour by calling [phone].   DIET: Your first meal following the procedure should be a small meal and then it is ok to progress to your normal diet. Heavy or fried foods are harder to digest and may make you feel nauseous or bloated.  Likewise, meals heavy in dairy and vegetables can increase  bloating.  Drink plenty of fluids but you should avoid alcoholic beverages for 24 hours.  ACTIVITY:  You should plan to take it easy for the rest of today and you should NOT DRIVE or use heavy machinery until tomorrow (because of the sedation medicines used during the test).    FOLLOW UP: Our staff will call the number listed on your records the next business day following your procedure to check on you and address any questions or concerns that you may have regarding the information given to you following your procedure. If we do not reach you, we will leave a message.  However, if you are feeling well and you are not experiencing any problems, there is no need to return our call.  We will assume that you have returned to your regular daily activities without incident.  If any biopsies were taken you will be contacted by phone or by letter within the next 1-3 weeks.  Please call us at [phone] if you have not heard about the biopsies in 3 weeks.    SIGNATURES/CONFIDENTIALITY: You and/or your care partner have signed paperwork which will be entered into your electronic medical record.  These signatures attest to the fact that that the information above on your After Visit Summary has been reviewed and is understood.  Full responsibility of the confidentiality of this discharge information lies with you and/or your care-partner.

## 2016-03-09 NOTE — Op Note (Addendum)
[facility] Patient Name: Darlene Burns Procedure Date: 03/09/2016 11:16 AM MRN: [id number] Endoscopist: Sherilyn Cooter L. Myrtie Neither , MD Age: 65 Referring MD:  Date of Birth: [birth year] Gender: Female Account #: [id number] Procedure:                Colonoscopy Indications:              Screening for colorectal malignant neoplasm Medicines:                Monitored Anesthesia Care Procedure:                Pre-Anesthesia Assessment:                           - Prior to the procedure, a History and Physical                            was performed, and patient medications and                            allergies were reviewed. The patient's tolerance of                            previous anesthesia was also reviewed. The risks                            and benefits of the procedure and the sedation                            options and risks were discussed with the patient.                            All questions were answered, and informed consent                            was obtained. Prior Anticoagulants: The patient has                            taken no previous anticoagulant or antiplatelet                            agents. ASA Grade Assessment: II - A patient with                            mild systemic disease. After reviewing the risks                            and benefits, the patient was deemed in                            satisfactory condition to undergo the procedure.                           After obtaining informed consent, the colonoscope  was passed under direct vision. Throughout the                            procedure, the patient's blood pressure, pulse, and                            oxygen saturations were monitored continuously. The                            Model CF-HQ190L [id number]) scope was introduced                            through the anus and advanced to the the cecum,                            identified by  appendiceal orifice and ileocecal                            valve. The colonoscopy was performed without                            difficulty. The patient tolerated the procedure                            well. The quality of the bowel preparation was                            excellent. The ileocecal valve, appendiceal                            orifice, and rectum were photographed. The bowel                            preparation used was Miralax. Scope In: 11:22:40 AM Scope Out: 11:35:54 AM Scope Withdrawal Time: 0 hours 8 minutes 30 seconds  Total Procedure Duration: 0 hours 13 minutes 14 seconds  Findings:                 The perianal and digital rectal examinations were                            normal.                           A few small-mouthed diverticula were found in the                            sigmoid colon.                           The exam was otherwise without abnormality on                            direct and retroflexion views. Complications:            No immediate complications. Estimated Blood Loss:  Estimated blood loss: none. Impression:               - Diverticulosis in the sigmoid colon.                           - The examination was otherwise normal on direct                            and retroflexion views.                           - No specimens collected. Recommendation:           - Patient has a contact number available for                            emergencies. The signs and symptoms of potential                            delayed complications were discussed with the                            patient. Return to normal activities tomorrow.                            Written discharge instructions were provided to the                            patient.                           - Resume previous diet.                           - Continue present medications.                           - Repeat colonoscopy in 10 years for screening                             purposes. Henry L. Myrtie Neitheranis, MD 03/09/2016 11:41:57 AM This report has been signed electronically.

## 2016-03-10 ENCOUNTER — Telehealth: Payer: Self-pay | Admitting: *Deleted

## 2016-03-10 NOTE — Telephone Encounter (Signed)
  Follow up Call-  Call back number 03/09/2016  Post procedure Call Back phone  # [phone]  Permission to leave phone message Yes     Patient questions:  Do you have a fever, pain , or abdominal swelling? Yes.   Pain Score  1 *  Have you tolerated food without any problems? Yes.    Have you been able to return to your normal activities? Yes.    Do you have any questions about your discharge instructions: Diet   No. Medications  No. Follow up visit  No.  Do you have questions or concerns about your Care? Yes.  Patient states that her R eye is watering profusely and her r nostril is burning and running . Discharge is clear. No fever. Patient states that it is very annoying, and that she thinks that it's due to the oxygen.  I told her that I would forward her problem to the doctor to see if he had any thoughts about it.  She will call me later if the eye and nostril issues continue.  Actions: * If pain score is 4 or above: No action needed, pain <4.

## 2016-03-10 NOTE — Telephone Encounter (Signed)
Thanks for letting me know. Could be due to the oxygen.  If so, I suspect it will resolve on its own. If still bothersome, I could take a look at it tomorrow if she wants to come by the clinic in the afternoon.

## 2016-03-10 NOTE — Telephone Encounter (Signed)
Thanks for letting me know. Could be due to the oxygen.  If so, I suspect it will resolve on its own.   If very bothersome, I could take a look at it tomorrow AM if she wants to come by the office in the afternoon

## 2016-03-28 ENCOUNTER — Ambulatory Visit: Payer: Medicare Other | Admitting: *Deleted

## 2016-03-28 DIAGNOSIS — G248 Other dystonia: Secondary | ICD-10-CM | POA: Diagnosis not present

## 2016-03-28 DIAGNOSIS — R269 Unspecified abnormalities of gait and mobility: Secondary | ICD-10-CM | POA: Diagnosis not present

## 2016-03-28 DIAGNOSIS — M2450 Contracture, unspecified joint: Secondary | ICD-10-CM

## 2016-03-29 NOTE — Progress Notes (Signed)
Patient ID: Darlene Burns, female   DOB: 07/11/1951, 65 y.o.   MRN: [id number] Patient presents to be [facility].  Patient will return in 4 weeks to be fitted.

## 2016-04-25 ENCOUNTER — Ambulatory Visit: Payer: Medicare Other | Admitting: Podiatry

## 2016-05-04 ENCOUNTER — Ambulatory Visit (INDEPENDENT_AMBULATORY_CARE_PROVIDER_SITE_OTHER): Payer: Medicare Other | Admitting: Podiatry

## 2016-05-04 ENCOUNTER — Encounter: Payer: Self-pay | Admitting: Podiatry

## 2016-05-04 DIAGNOSIS — G248 Other dystonia: Secondary | ICD-10-CM

## 2016-05-04 DIAGNOSIS — M2450 Contracture, unspecified joint: Secondary | ICD-10-CM

## 2016-05-06 NOTE — Progress Notes (Signed)
Subjective: 65 year old female presents the office today 4 weeks after getting [place] brace. She states that she is unable to wear the brace as it does rub and is not comfortable. She has tried to break it and not having much success. Denies any systemic complaints such as fevers, chills, nausea, vomiting. No acute changes since last appointment, and no other complaints at this time.   Objective: AAO x3, NAD DP/PT pulses palpable bilaterally, CRT less than 3 seconds Focal dystonia is presently muscle contractures present right foot as well as hallux and plantar flexed position. Upon gait evaluation the forefoot is adducted. No edema, erythema, increase in warmth to bilateral lower extremities.  No open lesions or pre-ulcerative lesions.  No pain with calf compression, swelling, warmth, erythema  Assessment: Focal dystonia with contractures right side.  Plan: -All treatment options discussed with the patient including all alternatives, risks, complications.  -She is unable to wear the brace. I would have her follow-up in a couple weeks with me and [facility] so we can evaluate if the brace should be modified or if a new brace would be more beneficial. -Patient encouraged to call the office with any questions, concerns, change in symptoms.   Ovid CurdMatthew Arilyn Brierley, DPM

## 2016-05-30 ENCOUNTER — Other Ambulatory Visit: Payer: Medicare Other | Admitting: *Deleted

## 2016-09-02 ENCOUNTER — Ambulatory Visit: Payer: Medicare Other | Attending: Psychiatry | Admitting: Rehabilitation

## 2016-09-02 DIAGNOSIS — R29810 Facial weakness: Secondary | ICD-10-CM

## 2016-09-02 NOTE — Patient Instructions (Signed)
Facial Exercise: Upper Lip    Push upper lip forward. Hold ____ seconds. Relax. Repeat ____ times per set. Do ____ sets per session. Do ____ sessions per day.  Copyright  VHI. All rights reserved.  Facial Exercise: Squint    Squeeze eyes tightly shut. Hold ____ seconds. Relax. Repeat ____ times per set. Do ____ sets per session. Do ____ sessions per day.  Copyright  VHI. All rights reserved.  Facial Exercise: Smile    Turn up corners of mouth. Hold ____ seconds. Relax. Repeat ____ times per set. Do ____ sets per session. Do ____ sessions per day.  Copyright  VHI. All rights reserved.  Facial Exercise: Pursed Lips    Suck in cheeks and push lips forward. Hold ____ seconds. Relax. Repeat ____ times per set. Do ____ sets per session. Do ____ sessions per day.  Copyright  VHI. All rights reserved.  Facial Exercise: Platysma Frown    Turn down corners of mouth and, looking up, tighten muscles at front of neck. Hold ____ seconds. Relax. Repeat ____ times per set. Do ____ sets per session. Do ____ sessions per day.  Copyright  VHI. All rights reserved.  Facial Exercise: Nose Wrinkle    Wrinkle nose. Hold ____ seconds. Relax. Repeat ____ times per set. Do ____ sets per session. Do ____ sessions per day.  Copyright  VHI. All rights reserved.  Facial Exercise: Lower Lip    Push lower lip forward. Hold ____ seconds. Relax. Repeat ____ times per set. Do ____ sets per session. Do ____ sessions per day.  Copyright  VHI. All rights reserved.  Facial Exercise: Eyebrow Raise    Raise eyebrows. Hold ____ seconds. Relax. Repeat ____ times per set. Do ____ sets per session. Do ____ sessions per day.  Copyright  VHI. All rights reserved.  Facial Exercise: Eyebrow Frown    Bring eyebrows together in a frown. Hold ____ seconds. Relax. Repeat ____ times per set. Do ____ sets per session. Do ____ sessions per day.  Copyright  VHI. All rights reserved.

## 2016-09-03 NOTE — Therapy (Signed)
[facility] [address] [place], [place], [address] Phone: [phone]   Fax:  [phone]  Physical Therapy Evaluation  Patient Details  Name: Darlene FramesBrenda T Leavell MRN: [id number] Date of Birth: [birth year] Referring Provider: Raquel SarnaNina Browner, MD  Encounter Date: 09/02/2016      PT End of Session - 09/03/16 0758    Visit Number 1   Number of Visits 1   Authorization Type [employer] MDC   PT Start Time 1535  did not need full time for eval   PT Stop Time 1610   PT Time Calculation (min) 35 min   Activity Tolerance Patient tolerated treatment well   Behavior During Therapy [facility] for tasks assessed/performed      Past Medical History:  Diagnosis Date  . Allergy   . Arthritis    hands feet neck   . Dystonia   . Fibromyalgia   . History of shingles   . Hypercholesteremia   . Other seizures (HCC)    seizures until age 89, none since  . Thyroid disorder     Past Surgical History:  Procedure Laterality Date  . COLONOSCOPY    . LAPAROSCOPY  1992  . TONSILLECTOMY  1956    There were no vitals filed for this visit.       Subjective Assessment - 09/02/16 1539    Subjective "Sept 11th, I woke up and the R side of my face was numb so I am here to get electrial stimulation to the facial muscles."    Patient Stated Goals "To get full return of my muscles in my face."    Currently in Pain? No/denies            [facility] Assessment - 09/03/16 0001      Assessment   Medical Diagnosis Bells Palsy   Referring Provider Raquel SarnaNina Browner, MD   Onset Date/Surgical Date --  June 2017     Precautions   Precautions None     Restrictions   Weight Bearing Restrictions No     Balance Screen   Has the patient fallen in the past 6 months No     Home Environment   Living Environment Private residence   Living Arrangements Alone     Prior Function   Level of Independence Independent     Sensation   Light Touch Impaired  Detail   Additional Comments Pt with decreased sensation on R side of face, esp around mouth and jaw     ROM / Strength   AROM / PROM / Strength Strength     Strength   Overall Strength Other (comment)   Overall Strength Comments Pt with mid to lower facial weakness, upper 1/3 face grossly [facility]                           PT Education - 09/03/16 0755    Education provided Yes   Education Details education on recovery of Bell's Palsy, research involving estim for treatment of Bell's Palsy and HEP for facial muscles   Person(s) Educated Patient   Methods Explanation;Demonstration;Handout   Comprehension Verbalized understanding;Returned demonstration                    Plan - 09/03/16 0758    Clinical Impression Statement Pt presents with diagnosis of Bell's Palsy in June of this year.  She has noted good improvement from time of original diagnosis however still has  some numbness and weakness.  Note history of hemi dystonia.  Pt presents wanting electrial stimulation for facial muscles per MD, however following PT research during session (to colleague who has reviewed many studies on effectiveness of estim for Bell's Palsy) note that estim for Bell's Palsy is not supported in the research.  Discussed this with pt and also that because it is not greatly supported, it would likely be difficult to get it covered by insurance.  PT did provide facial exercises during eval and went over them with pt.  See pt insruction.   Also note that PT gave pt her order for PT back in case she felt that she still wanted to pursue estim from another clinic.  PT to D/C pt at this time.     PT Frequency One time visit   PT Treatment/Interventions Therapeutic exercise   Consulted and Agree with Plan of Care Patient      Patient will benefit from skilled therapeutic intervention in order to improve the following deficits and impairments:  Impaired sensation, Decreased strength  Visit  Diagnosis: Facial weakness      G-Codes - 09/03/16 0804    Functional Assessment Tool Used Facial weakness   Functional Limitation Self care   Self Care Current Status ([id number](G8987) At least 1 percent but less than 20 percent impaired, limited or restricted   Self Care Goal Status (U0454(G8988) At least 1 percent but less than 20 percent impaired, limited or restricted   Self Care Discharge Status [phone](G8989) At least 1 percent but less than 20 percent impaired, limited or restricted       Problem List Patient Active Problem List   Diagnosis Date Noted  . Dystonia 12/19/2013  . Focal dystonia 07/04/2012  . Burning or prickling sensation 07/04/2012    Harriet ButteEmily Arshiya Jakes, PT, MPT [facility] [address] [place], [place], [address] Phone: [phone]   Fax:  [phone] 09/03/16, 8:07 AM  Name: Darlene FramesBrenda T Sobieski MRN: [id number] Date of Birth: 01-21-1951

## 2016-12-07 ENCOUNTER — Other Ambulatory Visit: Payer: Medicare Other

## 2016-12-08 ENCOUNTER — Other Ambulatory Visit: Payer: Medicare Other

## 2016-12-26 ENCOUNTER — Other Ambulatory Visit: Payer: Self-pay | Admitting: Family Medicine

## 2016-12-26 DIAGNOSIS — Z1231 Encounter for screening mammogram for malignant neoplasm of breast: Secondary | ICD-10-CM

## 2017-01-23 ENCOUNTER — Ambulatory Visit (INDEPENDENT_AMBULATORY_CARE_PROVIDER_SITE_OTHER): Payer: Medicare Other | Admitting: Podiatry

## 2017-01-23 DIAGNOSIS — G249 Dystonia, unspecified: Secondary | ICD-10-CM

## 2017-01-23 DIAGNOSIS — R269 Unspecified abnormalities of gait and mobility: Secondary | ICD-10-CM

## 2017-01-23 NOTE — Progress Notes (Signed)
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 We received instructions from Dr. Peyton BottomsBrowder who advised against any sort of bracing to address her issue.    F/O to be fabricated from [employer]...right foot with Deep heel cup, 4* valgus FF wedge and post.  speco cover..left foot stanard posting and heel..  L3020 x 2 to be dropped.

## 2017-01-30 ENCOUNTER — Ambulatory Visit
Admission: RE | Admit: 2017-01-30 | Discharge: 2017-01-30 | Disposition: A | Discharge status: Home / Self Care | Payer: Medicare Other | Source: Ambulatory Visit | Attending: Family Medicine | Admitting: Family Medicine

## 2017-01-30 DIAGNOSIS — Z1231 Encounter for screening mammogram for malignant neoplasm of breast: Secondary | ICD-10-CM

## 2017-02-14 ENCOUNTER — Other Ambulatory Visit: Payer: Medicare Other

## 2017-02-15 ENCOUNTER — Other Ambulatory Visit: Payer: Medicare Other

## 2017-03-02 ENCOUNTER — Other Ambulatory Visit: Payer: Medicare Other | Admitting: Orthotics

## 2018-01-16 ENCOUNTER — Other Ambulatory Visit: Payer: Self-pay | Admitting: Family Medicine

## 2018-01-16 DIAGNOSIS — Z1231 Encounter for screening mammogram for malignant neoplasm of breast: Secondary | ICD-10-CM

## 2018-02-21 ENCOUNTER — Ambulatory Visit: Payer: Medicare Other

## 2018-02-23 ENCOUNTER — Ambulatory Visit
Admission: RE | Admit: 2018-02-23 | Discharge: 2018-02-23 | Disposition: A | Discharge status: Home / Self Care | Payer: Medicare Other | Source: Ambulatory Visit | Attending: Family Medicine | Admitting: Family Medicine

## 2018-02-23 DIAGNOSIS — Z1231 Encounter for screening mammogram for malignant neoplasm of breast: Secondary | ICD-10-CM

## 2018-03-28 ENCOUNTER — Encounter: Payer: Self-pay | Admitting: Rheumatology

## 2018-10-17 ENCOUNTER — Encounter: Payer: Self-pay | Admitting: Rheumatology

## 2019-01-22 ENCOUNTER — Other Ambulatory Visit: Payer: Self-pay | Admitting: Family Medicine

## 2019-01-22 DIAGNOSIS — Z1231 Encounter for screening mammogram for malignant neoplasm of breast: Secondary | ICD-10-CM

## 2019-03-18 ENCOUNTER — Other Ambulatory Visit: Payer: Self-pay

## 2019-03-18 ENCOUNTER — Ambulatory Visit
Admission: RE | Admit: 2019-03-18 | Discharge: 2019-03-18 | Disposition: A | Discharge status: Home / Self Care | Payer: Medicare Other | Source: Ambulatory Visit | Attending: Family Medicine | Admitting: Family Medicine

## 2019-03-18 DIAGNOSIS — Z1231 Encounter for screening mammogram for malignant neoplasm of breast: Secondary | ICD-10-CM

## 2019-06-28 NOTE — Progress Notes (Signed)
Office Visit Note  Patient: Darlene Burns             Date of Birth: 12-16-1950           MRN: [id number]             PCP: Carol Ada, MD Referring: Carol Ada, MD Visit Date: 07/11/2019 Occupation: Retired, Proofreader  Subjective:  Neck and lower back pain.   History of Present Illness: Darlene Burns is a 68 y.o. female with history of degenerative disc disease and fibromyalgia syndrome.  She states she was diagnosed with fibromyalgia syndrome in 1995 and has dealt with those symptoms over the years.  About 5 years ago she started having lower back pain and was seen by Dr. Onnie Graham at the time MRI was consistent with degenerative disc disease and facet joint arthropathy.  2 years ago she started having increased neck pain.  She was seen by Dr. Onnie Graham who did MRI of her cervical spine which also showed facet joint arthropathy mainly.  She states the pain from her cervical spine was radiating to her right shoulder joint.  She went through physical therapy which was very helpful.  It improved the range of motion in her cervical spine.  She has history of focal dystonia which causes muscle spasms.  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  Due to her foot issues she has discomfort in her knee joints in her hips as well.  She is also has arthritis in her hands.  She has noticed intermittent swelling in the right forearm.  Activities of Daily Living:  Patient reports morning stiffness for 24 hours.   Patient Denies nocturnal pain.  Difficulty dressing/grooming: Denies Difficulty climbing stairs: Reports lower back pain and lower extremity weakness Difficulty getting out of chair: Reports Difficulty using hands for taps, buttons, cutlery, and/or writing: Reports  Review of Systems  Constitutional: Positive for fatigue. Negative for night sweats, weight gain and weight loss.  HENT: Negative for mouth sores, trouble swallowing, trouble swallowing, mouth  dryness and nose dryness.   Eyes: Positive for dryness. Negative for pain, redness and visual disturbance.  Respiratory: Negative for cough, shortness of breath and difficulty breathing.   Cardiovascular: Negative for chest pain, palpitations, hypertension, irregular heartbeat and swelling in legs/feet.  Gastrointestinal: Negative for blood in stool, constipation and diarrhea.  Endocrine: Negative for increased urination.  Genitourinary: Negative for involuntary urination and vaginal dryness.  Musculoskeletal: Positive for arthralgias, gait problem, joint pain, myalgias, morning stiffness and myalgias. Negative for joint swelling, muscle weakness and muscle tenderness.  Skin: Negative for color change, rash, hair loss, skin tightness, ulcers and sensitivity to sunlight.  Allergic/Immunologic: Negative for susceptible to infections.  Neurological: Negative for dizziness, memory loss, night sweats and weakness.  Hematological: Negative for bruising/bleeding tendency and swollen glands.  Psychiatric/Behavioral: Positive for sleep disturbance. Negative for depressed mood. The patient is not nervous/anxious.     PMFS History:  Patient Active Problem List   Diagnosis Date Noted  . History of chronic kidney disease 07/11/2019  . History of hypothyroidism 07/11/2019  . History of hyperlipidemia 07/11/2019  . Diverticulosis 07/11/2019  . Other insomnia 07/11/2019  . Fibromyalgia 07/11/2019  . DDD (degenerative disc disease), cervical 07/11/2019  . DDD (degenerative disc disease), lumbar 07/11/2019  . Dystonia 12/19/2013  . Focal dystonia 07/04/2012  . Burning or prickling sensation 07/04/2012    Past Medical History:  Diagnosis Date  . Allergy   . Arthritis  hands feet neck   . Dystonia   . Fibromyalgia   . Focal dystonia   . History of shingles   . Hypercholesteremia   . Kidney disease   . Other seizures (HCC)    seizures until age 8, none since  . Thyroid disorder     Family  History  Problem Relation Age of Onset  . Lung cancer Father   . Arthritis Mother   . Colon cancer Neg Hx   . Colon polyps Neg Hx   . Rectal cancer Neg Hx   . Stomach cancer Neg Hx   . Esophageal cancer Neg Hx    Past Surgical History:  Procedure Laterality Date  . COLONOSCOPY    . LAPAROSCOPY  1992  . TONSILLECTOMY  1956   Social History   Social History Narrative  . Not on file    There is no immunization history on file for this patient.   Objective: Vital Signs: BP 126/74 (BP Location: Right Arm, Patient Position: Sitting, Cuff Size: Normal)   Pulse 68   Resp 16   Ht 5\' 3"  (1.6 m)   Wt 138 lb 12.8 oz (63 kg)   BMI 24.59 kg/m    Physical Exam Vitals signs and nursing note reviewed.  Constitutional:      Appearance: She is well-developed.  HENT:     Head: Normocephalic and atraumatic.  Eyes:     Conjunctiva/sclera: Conjunctivae normal.  Neck:     Musculoskeletal: Normal range of motion.  Cardiovascular:     Rate and Rhythm: Normal rate and regular rhythm.     Heart sounds: Normal heart sounds.  Pulmonary:     Effort: Pulmonary effort is normal.     Breath sounds: Normal breath sounds.  Abdominal:     General: Bowel sounds are normal.     Palpations: Abdomen is soft.  Lymphadenopathy:     Cervical: No cervical adenopathy.  Skin:    General: Skin is warm and dry.     Capillary Refill: Capillary refill takes less than 2 seconds.  Neurological:     Mental Status: She is alert and oriented to person, place, and time.  Psychiatric:        Behavior: Behavior normal.      Musculoskeletal Exam: Patient had limited range of motion of her cervical spine.  She also has limited range of motion of her lumbar spine.  No SI joint tenderness was noted.  She has discomfort range of motion of her right shoulder joint.  Elbow joints and wrist joints are in good range of motion.  She has DIP and PIP thickening in her hands.  No synovitis was noted.  Hip joints and knee  joints were in good range of motion with no synovitis.  She has arthritic changes in her right foot with first MTP thickening and hammertoes in her right foot.  Due to focal dystonia she has normal gait. CDAI Exam: CDAI Score: - Patient Global: -; Provider Global: - Swollen: -; Tender: - Joint Exam   No joint exam has been documented for this visit   There is currently no information documented on the homunculus. Go to the Rheumatology activity and complete the homunculus joint exam.  Investigation: No additional findings. March 28, 2018 MRI of the cervical spine done at [facility] reveals moderate to severe left C5 5 6 neural foraminal narrowing impinging upon the left 6th nerve roots, moderate left C 6 7 neural foraminal narrowing, severe right C1-2 articular  joint arthrosis and moderate right atlantooccipital joint degenerative changes contacting right C2 nerve roots without displacement, bilateral C2-3, bilateral C3-4 and right C7- T1 facet ankylosis. IMPRESSION: 1. Compared with an abdominal CT from nearly 8 years ago, there is mildly progressive lower lumbar spondylosis. 2. At L3-4 and L4-5, disc degeneration and facet hypertrophy contribute to mild narrowing of the lateral recesses bilaterally. There is no foraminal compromise or definite nerve root encroachment. 3. Lower lumbar facet disease also appears progressive, greatest on the right at L5-S1. 4. No definite acute findings or high-grade spinal stenosis. Electronically Signed   By: Roxy HorsemanBill  Veazey M.D.   On: 01/07/2014 11:51 Imaging: No results found.  Recent Labs: Lab Results  Component Value Date   NA 140 04/07/2014   K 4.7 04/07/2014   CL 102 04/07/2014   CO2 30 04/07/2014   GLUCOSE 84 04/07/2014   BUN 16 04/07/2014   CREATININE 0.75 04/07/2014   CALCIUM 9.5 04/07/2014    Speciality Comments: No specialty comments available.  Procedures:  No procedures performed Allergies: Amoxicillin, Oxcarbazepine,  Prednisone, Sulfa antibiotics, and Sulfamethoxazole   Assessment / Plan:     Visit Diagnoses: Polyarthralgia -patient complains of pain in multiple joints which she relates to focal dystonia in her right foot.  She states due to abnormal gait she has discomfort in her knees and her hip joints.  She also has chronic lower back pain and neck pain.  No synovitis was noted on the examination today.  10/17/18: TSH 1.32  DDD (degenerative disc disease), cervical - And facet joint arthropathy.  I reviewed the MRI and x-rays of her cervical spine.  Which showed significant arthrosis of the articular joints.  Some disc disease was also noted.  DDD (degenerative disc disease), lumbar-x-rays were reviewed today.  Which showed scoliosis, multilevel spondylosis.  No syndesmophytes were noted.  SI joints were also visualized on the x-rays which she brought from the chiropractor which did not show any sclerosis or narrowing.  I showed patient that these findings are not consistent with ankylosing spondylitis.  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  Fibromyalgia-diagnosed in 90s and she tolerates the generalized pain.  Other insomnia-manageable with trazodone.  History of chronic kidney disease - Stage 3  History of depression  Diverticulosis  History of hyperlipidemia  Focal dystonia  History of hypothyroidism  Orders: No orders of the defined types were placed in this encounter.  No orders of the defined types were placed in this encounter.   Face-to-face time spent with patient was 45 minutes. Greater than 50% of time was spent in counseling and coordination of care.  Follow-Up Instructions: Return if symptoms worsen or fail to improve, for DDD and osteoarthritis.   Pollyann SavoyShaili Christianna Belmonte, MD  Note - This record has been created using [employer] software.  Chart creation errors have been sought, but may not always  have  been located. Such creation errors do not reflect on  the standard of medical care.

## 2019-07-11 ENCOUNTER — Ambulatory Visit (INDEPENDENT_AMBULATORY_CARE_PROVIDER_SITE_OTHER): Payer: Medicare Other | Admitting: Rheumatology

## 2019-07-11 ENCOUNTER — Other Ambulatory Visit: Payer: Self-pay

## 2019-07-11 ENCOUNTER — Encounter: Payer: Self-pay | Admitting: Rheumatology

## 2019-07-11 VITALS — BP 126/74 | HR 68 | Resp 16 | Ht 63.0 in | Wt 138.8 lb

## 2019-07-11 DIAGNOSIS — G4709 Other insomnia: Secondary | ICD-10-CM

## 2019-07-11 DIAGNOSIS — Z87448 Personal history of other diseases of urinary system: Secondary | ICD-10-CM

## 2019-07-11 DIAGNOSIS — Z8639 Personal history of other endocrine, nutritional and metabolic disease: Secondary | ICD-10-CM | POA: Insufficient documentation

## 2019-07-11 DIAGNOSIS — M5030 Other cervical disc degeneration, unspecified cervical region: Secondary | ICD-10-CM | POA: Diagnosis not present

## 2019-07-11 DIAGNOSIS — M51369 Other intervertebral disc degeneration, lumbar region without mention of lumbar back pain or lower extremity pain: Secondary | ICD-10-CM | POA: Insufficient documentation

## 2019-07-11 DIAGNOSIS — M19271 Secondary osteoarthritis, right ankle and foot: Secondary | ICD-10-CM

## 2019-07-11 DIAGNOSIS — Z8659 Personal history of other mental and behavioral disorders: Secondary | ICD-10-CM

## 2019-07-11 DIAGNOSIS — M5136 Other intervertebral disc degeneration, lumbar region: Secondary | ICD-10-CM

## 2019-07-11 DIAGNOSIS — K5790 Diverticulosis of intestine, part unspecified, without perforation or abscess without bleeding: Secondary | ICD-10-CM

## 2019-07-11 DIAGNOSIS — M2550 Pain in unspecified joint: Secondary | ICD-10-CM | POA: Diagnosis not present

## 2019-07-11 DIAGNOSIS — G248 Other dystonia: Secondary | ICD-10-CM

## 2019-07-11 DIAGNOSIS — M797 Fibromyalgia: Secondary | ICD-10-CM | POA: Insufficient documentation

## 2019-08-08 ENCOUNTER — Ambulatory Visit: Payer: Medicare Other | Admitting: Rheumatology

## 2020-02-28 DIAGNOSIS — Z79899 Other long term (current) drug therapy: Secondary | ICD-10-CM | POA: Diagnosis not present

## 2020-02-28 DIAGNOSIS — L661 Lichen planopilaris: Secondary | ICD-10-CM | POA: Diagnosis not present

## 2020-03-16 DIAGNOSIS — L661 Lichen planopilaris: Secondary | ICD-10-CM | POA: Diagnosis not present

## 2020-03-16 DIAGNOSIS — Z79899 Other long term (current) drug therapy: Secondary | ICD-10-CM | POA: Diagnosis not present

## 2020-03-17 ENCOUNTER — Other Ambulatory Visit: Payer: Self-pay | Admitting: Family Medicine

## 2020-03-17 DIAGNOSIS — Z1231 Encounter for screening mammogram for malignant neoplasm of breast: Secondary | ICD-10-CM

## 2020-03-20 ENCOUNTER — Ambulatory Visit
Admission: RE | Admit: 2020-03-20 | Discharge: 2020-03-20 | Disposition: A | Discharge status: Home / Self Care | Payer: Medicare PPO | Source: Ambulatory Visit | Attending: Family Medicine | Admitting: Family Medicine

## 2020-03-20 ENCOUNTER — Other Ambulatory Visit: Payer: Self-pay

## 2020-03-20 DIAGNOSIS — Z1231 Encounter for screening mammogram for malignant neoplasm of breast: Secondary | ICD-10-CM | POA: Diagnosis not present

## 2020-04-08 DIAGNOSIS — R0602 Shortness of breath: Secondary | ICD-10-CM | POA: Diagnosis not present

## 2020-04-08 DIAGNOSIS — R202 Paresthesia of skin: Secondary | ICD-10-CM | POA: Diagnosis not present

## 2020-04-08 DIAGNOSIS — R2681 Unsteadiness on feet: Secondary | ICD-10-CM | POA: Diagnosis not present

## 2020-04-08 DIAGNOSIS — R5383 Other fatigue: Secondary | ICD-10-CM | POA: Diagnosis not present

## 2020-04-08 DIAGNOSIS — E559 Vitamin D deficiency, unspecified: Secondary | ICD-10-CM | POA: Diagnosis not present

## 2020-04-09 DIAGNOSIS — Z20822 Contact with and (suspected) exposure to covid-19: Secondary | ICD-10-CM | POA: Diagnosis not present

## 2020-04-22 DIAGNOSIS — Z79899 Other long term (current) drug therapy: Secondary | ICD-10-CM | POA: Diagnosis not present

## 2020-04-22 DIAGNOSIS — L661 Lichen planopilaris: Secondary | ICD-10-CM | POA: Diagnosis not present

## 2020-05-20 DIAGNOSIS — M24574 Contracture, right foot: Secondary | ICD-10-CM | POA: Diagnosis not present

## 2020-05-20 DIAGNOSIS — L603 Nail dystrophy: Secondary | ICD-10-CM | POA: Diagnosis not present

## 2020-05-20 DIAGNOSIS — L600 Ingrowing nail: Secondary | ICD-10-CM | POA: Diagnosis not present

## 2020-05-20 DIAGNOSIS — M2041 Other hammer toe(s) (acquired), right foot: Secondary | ICD-10-CM | POA: Diagnosis not present

## 2020-06-01 DIAGNOSIS — M2041 Other hammer toe(s) (acquired), right foot: Secondary | ICD-10-CM | POA: Diagnosis not present

## 2020-06-01 DIAGNOSIS — B351 Tinea unguium: Secondary | ICD-10-CM | POA: Diagnosis not present

## 2020-06-17 DIAGNOSIS — Z23 Encounter for immunization: Secondary | ICD-10-CM | POA: Diagnosis not present

## 2020-07-23 DIAGNOSIS — Z79899 Other long term (current) drug therapy: Secondary | ICD-10-CM | POA: Diagnosis not present

## 2020-07-23 DIAGNOSIS — L661 Lichen planopilaris: Secondary | ICD-10-CM | POA: Diagnosis not present

## 2020-09-08 DIAGNOSIS — Z20822 Contact with and (suspected) exposure to covid-19: Secondary | ICD-10-CM | POA: Diagnosis not present

## 2020-09-22 DIAGNOSIS — S335XXA Sprain of ligaments of lumbar spine, initial encounter: Secondary | ICD-10-CM | POA: Diagnosis not present

## 2020-09-22 DIAGNOSIS — M9903 Segmental and somatic dysfunction of lumbar region: Secondary | ICD-10-CM | POA: Diagnosis not present

## 2020-09-22 DIAGNOSIS — M9901 Segmental and somatic dysfunction of cervical region: Secondary | ICD-10-CM | POA: Diagnosis not present

## 2020-09-22 DIAGNOSIS — S138XXA Sprain of joints and ligaments of other parts of neck, initial encounter: Secondary | ICD-10-CM | POA: Diagnosis not present

## 2020-09-23 DIAGNOSIS — M9903 Segmental and somatic dysfunction of lumbar region: Secondary | ICD-10-CM | POA: Diagnosis not present

## 2020-09-23 DIAGNOSIS — S138XXA Sprain of joints and ligaments of other parts of neck, initial encounter: Secondary | ICD-10-CM | POA: Diagnosis not present

## 2020-09-23 DIAGNOSIS — M9901 Segmental and somatic dysfunction of cervical region: Secondary | ICD-10-CM | POA: Diagnosis not present

## 2020-09-23 DIAGNOSIS — S335XXA Sprain of ligaments of lumbar spine, initial encounter: Secondary | ICD-10-CM | POA: Diagnosis not present

## 2020-09-28 DIAGNOSIS — M9901 Segmental and somatic dysfunction of cervical region: Secondary | ICD-10-CM | POA: Diagnosis not present

## 2020-09-28 DIAGNOSIS — M9903 Segmental and somatic dysfunction of lumbar region: Secondary | ICD-10-CM | POA: Diagnosis not present

## 2020-09-28 DIAGNOSIS — S335XXA Sprain of ligaments of lumbar spine, initial encounter: Secondary | ICD-10-CM | POA: Diagnosis not present

## 2020-09-28 DIAGNOSIS — S138XXA Sprain of joints and ligaments of other parts of neck, initial encounter: Secondary | ICD-10-CM | POA: Diagnosis not present

## 2020-09-30 DIAGNOSIS — M9903 Segmental and somatic dysfunction of lumbar region: Secondary | ICD-10-CM | POA: Diagnosis not present

## 2020-09-30 DIAGNOSIS — S335XXA Sprain of ligaments of lumbar spine, initial encounter: Secondary | ICD-10-CM | POA: Diagnosis not present

## 2020-09-30 DIAGNOSIS — M9901 Segmental and somatic dysfunction of cervical region: Secondary | ICD-10-CM | POA: Diagnosis not present

## 2020-09-30 DIAGNOSIS — S138XXA Sprain of joints and ligaments of other parts of neck, initial encounter: Secondary | ICD-10-CM | POA: Diagnosis not present

## 2020-10-08 DIAGNOSIS — M9903 Segmental and somatic dysfunction of lumbar region: Secondary | ICD-10-CM | POA: Diagnosis not present

## 2020-10-08 DIAGNOSIS — S335XXA Sprain of ligaments of lumbar spine, initial encounter: Secondary | ICD-10-CM | POA: Diagnosis not present

## 2020-10-08 DIAGNOSIS — S138XXA Sprain of joints and ligaments of other parts of neck, initial encounter: Secondary | ICD-10-CM | POA: Diagnosis not present

## 2020-10-08 DIAGNOSIS — M9901 Segmental and somatic dysfunction of cervical region: Secondary | ICD-10-CM | POA: Diagnosis not present

## 2020-10-12 DIAGNOSIS — S138XXA Sprain of joints and ligaments of other parts of neck, initial encounter: Secondary | ICD-10-CM | POA: Diagnosis not present

## 2020-10-12 DIAGNOSIS — S335XXA Sprain of ligaments of lumbar spine, initial encounter: Secondary | ICD-10-CM | POA: Diagnosis not present

## 2020-10-12 DIAGNOSIS — M9901 Segmental and somatic dysfunction of cervical region: Secondary | ICD-10-CM | POA: Diagnosis not present

## 2020-10-12 DIAGNOSIS — M9903 Segmental and somatic dysfunction of lumbar region: Secondary | ICD-10-CM | POA: Diagnosis not present

## 2020-10-15 DIAGNOSIS — S335XXA Sprain of ligaments of lumbar spine, initial encounter: Secondary | ICD-10-CM | POA: Diagnosis not present

## 2020-10-15 DIAGNOSIS — S138XXA Sprain of joints and ligaments of other parts of neck, initial encounter: Secondary | ICD-10-CM | POA: Diagnosis not present

## 2020-10-15 DIAGNOSIS — M9901 Segmental and somatic dysfunction of cervical region: Secondary | ICD-10-CM | POA: Diagnosis not present

## 2020-10-15 DIAGNOSIS — M9903 Segmental and somatic dysfunction of lumbar region: Secondary | ICD-10-CM | POA: Diagnosis not present

## 2020-10-19 DIAGNOSIS — M9901 Segmental and somatic dysfunction of cervical region: Secondary | ICD-10-CM | POA: Diagnosis not present

## 2020-10-19 DIAGNOSIS — S335XXA Sprain of ligaments of lumbar spine, initial encounter: Secondary | ICD-10-CM | POA: Diagnosis not present

## 2020-10-19 DIAGNOSIS — M9903 Segmental and somatic dysfunction of lumbar region: Secondary | ICD-10-CM | POA: Diagnosis not present

## 2020-10-19 DIAGNOSIS — S138XXA Sprain of joints and ligaments of other parts of neck, initial encounter: Secondary | ICD-10-CM | POA: Diagnosis not present

## 2020-10-22 DIAGNOSIS — S335XXA Sprain of ligaments of lumbar spine, initial encounter: Secondary | ICD-10-CM | POA: Diagnosis not present

## 2020-10-22 DIAGNOSIS — M9903 Segmental and somatic dysfunction of lumbar region: Secondary | ICD-10-CM | POA: Diagnosis not present

## 2020-10-22 DIAGNOSIS — M9901 Segmental and somatic dysfunction of cervical region: Secondary | ICD-10-CM | POA: Diagnosis not present

## 2020-10-22 DIAGNOSIS — S138XXA Sprain of joints and ligaments of other parts of neck, initial encounter: Secondary | ICD-10-CM | POA: Diagnosis not present

## 2020-10-26 DIAGNOSIS — S335XXA Sprain of ligaments of lumbar spine, initial encounter: Secondary | ICD-10-CM | POA: Diagnosis not present

## 2020-10-26 DIAGNOSIS — S138XXA Sprain of joints and ligaments of other parts of neck, initial encounter: Secondary | ICD-10-CM | POA: Diagnosis not present

## 2020-10-26 DIAGNOSIS — M9903 Segmental and somatic dysfunction of lumbar region: Secondary | ICD-10-CM | POA: Diagnosis not present

## 2020-10-26 DIAGNOSIS — M9901 Segmental and somatic dysfunction of cervical region: Secondary | ICD-10-CM | POA: Diagnosis not present

## 2020-10-29 DIAGNOSIS — S335XXA Sprain of ligaments of lumbar spine, initial encounter: Secondary | ICD-10-CM | POA: Diagnosis not present

## 2020-10-29 DIAGNOSIS — S138XXA Sprain of joints and ligaments of other parts of neck, initial encounter: Secondary | ICD-10-CM | POA: Diagnosis not present

## 2020-10-29 DIAGNOSIS — M9901 Segmental and somatic dysfunction of cervical region: Secondary | ICD-10-CM | POA: Diagnosis not present

## 2020-10-29 DIAGNOSIS — M9903 Segmental and somatic dysfunction of lumbar region: Secondary | ICD-10-CM | POA: Diagnosis not present

## 2020-11-02 DIAGNOSIS — M9903 Segmental and somatic dysfunction of lumbar region: Secondary | ICD-10-CM | POA: Diagnosis not present

## 2020-11-02 DIAGNOSIS — S335XXA Sprain of ligaments of lumbar spine, initial encounter: Secondary | ICD-10-CM | POA: Diagnosis not present

## 2020-11-02 DIAGNOSIS — M9901 Segmental and somatic dysfunction of cervical region: Secondary | ICD-10-CM | POA: Diagnosis not present

## 2020-11-02 DIAGNOSIS — S138XXA Sprain of joints and ligaments of other parts of neck, initial encounter: Secondary | ICD-10-CM | POA: Diagnosis not present

## 2020-11-05 DIAGNOSIS — S335XXA Sprain of ligaments of lumbar spine, initial encounter: Secondary | ICD-10-CM | POA: Diagnosis not present

## 2020-11-05 DIAGNOSIS — M9903 Segmental and somatic dysfunction of lumbar region: Secondary | ICD-10-CM | POA: Diagnosis not present

## 2020-11-05 DIAGNOSIS — S138XXA Sprain of joints and ligaments of other parts of neck, initial encounter: Secondary | ICD-10-CM | POA: Diagnosis not present

## 2020-11-05 DIAGNOSIS — M9901 Segmental and somatic dysfunction of cervical region: Secondary | ICD-10-CM | POA: Diagnosis not present

## 2020-11-16 DIAGNOSIS — Z1389 Encounter for screening for other disorder: Secondary | ICD-10-CM | POA: Diagnosis not present

## 2020-11-16 DIAGNOSIS — G4700 Insomnia, unspecified: Secondary | ICD-10-CM | POA: Diagnosis not present

## 2020-11-16 DIAGNOSIS — E039 Hypothyroidism, unspecified: Secondary | ICD-10-CM | POA: Diagnosis not present

## 2020-11-16 DIAGNOSIS — G248 Other dystonia: Secondary | ICD-10-CM | POA: Diagnosis not present

## 2020-11-16 DIAGNOSIS — Z0000 Encounter for general adult medical examination without abnormal findings: Secondary | ICD-10-CM | POA: Diagnosis not present

## 2020-11-16 DIAGNOSIS — E7800 Pure hypercholesterolemia, unspecified: Secondary | ICD-10-CM | POA: Diagnosis not present

## 2020-11-16 DIAGNOSIS — Z1159 Encounter for screening for other viral diseases: Secondary | ICD-10-CM | POA: Diagnosis not present

## 2020-11-16 DIAGNOSIS — N183 Chronic kidney disease, stage 3 unspecified: Secondary | ICD-10-CM | POA: Diagnosis not present

## 2020-11-17 DIAGNOSIS — S335XXA Sprain of ligaments of lumbar spine, initial encounter: Secondary | ICD-10-CM | POA: Diagnosis not present

## 2020-11-17 DIAGNOSIS — M9901 Segmental and somatic dysfunction of cervical region: Secondary | ICD-10-CM | POA: Diagnosis not present

## 2020-11-17 DIAGNOSIS — S138XXA Sprain of joints and ligaments of other parts of neck, initial encounter: Secondary | ICD-10-CM | POA: Diagnosis not present

## 2020-11-17 DIAGNOSIS — M9903 Segmental and somatic dysfunction of lumbar region: Secondary | ICD-10-CM | POA: Diagnosis not present

## 2020-11-23 DIAGNOSIS — L661 Lichen planopilaris: Secondary | ICD-10-CM | POA: Diagnosis not present

## 2020-11-23 DIAGNOSIS — L658 Other specified nonscarring hair loss: Secondary | ICD-10-CM | POA: Diagnosis not present

## 2020-11-23 DIAGNOSIS — L659 Nonscarring hair loss, unspecified: Secondary | ICD-10-CM | POA: Diagnosis not present

## 2020-12-01 DIAGNOSIS — S138XXA Sprain of joints and ligaments of other parts of neck, initial encounter: Secondary | ICD-10-CM | POA: Diagnosis not present

## 2020-12-01 DIAGNOSIS — M9903 Segmental and somatic dysfunction of lumbar region: Secondary | ICD-10-CM | POA: Diagnosis not present

## 2020-12-01 DIAGNOSIS — M9901 Segmental and somatic dysfunction of cervical region: Secondary | ICD-10-CM | POA: Diagnosis not present

## 2020-12-01 DIAGNOSIS — S335XXA Sprain of ligaments of lumbar spine, initial encounter: Secondary | ICD-10-CM | POA: Diagnosis not present

## 2020-12-05 DIAGNOSIS — R059 Cough, unspecified: Secondary | ICD-10-CM | POA: Diagnosis not present

## 2020-12-05 DIAGNOSIS — J069 Acute upper respiratory infection, unspecified: Secondary | ICD-10-CM | POA: Diagnosis not present

## 2020-12-24 DIAGNOSIS — L661 Lichen planopilaris: Secondary | ICD-10-CM | POA: Diagnosis not present

## 2020-12-24 DIAGNOSIS — L433 Subacute (active) lichen planus: Secondary | ICD-10-CM | POA: Diagnosis not present

## 2020-12-24 DIAGNOSIS — L218 Other seborrheic dermatitis: Secondary | ICD-10-CM | POA: Diagnosis not present

## 2021-02-22 ENCOUNTER — Other Ambulatory Visit: Payer: Self-pay | Admitting: Family Medicine

## 2021-02-22 DIAGNOSIS — Z1231 Encounter for screening mammogram for malignant neoplasm of breast: Secondary | ICD-10-CM

## 2021-02-23 DIAGNOSIS — Z79899 Other long term (current) drug therapy: Secondary | ICD-10-CM | POA: Diagnosis not present

## 2021-02-23 DIAGNOSIS — L433 Subacute (active) lichen planus: Secondary | ICD-10-CM | POA: Diagnosis not present

## 2021-02-23 DIAGNOSIS — L661 Lichen planopilaris: Secondary | ICD-10-CM | POA: Diagnosis not present

## 2021-04-16 ENCOUNTER — Ambulatory Visit: Payer: Medicare PPO

## 2021-04-16 ENCOUNTER — Ambulatory Visit
Admission: RE | Admit: 2021-04-16 | Discharge: 2021-04-16 | Disposition: A | Discharge status: Home / Self Care | Payer: Medicare PPO | Source: Ambulatory Visit | Attending: Family Medicine | Admitting: Family Medicine

## 2021-04-16 ENCOUNTER — Other Ambulatory Visit: Payer: Self-pay

## 2021-04-16 DIAGNOSIS — Z1231 Encounter for screening mammogram for malignant neoplasm of breast: Secondary | ICD-10-CM | POA: Diagnosis not present

## 2021-06-15 DIAGNOSIS — Z79899 Other long term (current) drug therapy: Secondary | ICD-10-CM | POA: Diagnosis not present

## 2021-06-15 DIAGNOSIS — D84821 Immunodeficiency due to drugs: Secondary | ICD-10-CM | POA: Diagnosis not present

## 2021-06-15 DIAGNOSIS — R030 Elevated blood-pressure reading, without diagnosis of hypertension: Secondary | ICD-10-CM | POA: Diagnosis not present

## 2021-06-15 DIAGNOSIS — Z882 Allergy status to sulfonamides status: Secondary | ICD-10-CM | POA: Diagnosis not present

## 2021-06-15 DIAGNOSIS — L439 Lichen planus, unspecified: Secondary | ICD-10-CM | POA: Diagnosis not present

## 2021-06-15 DIAGNOSIS — E785 Hyperlipidemia, unspecified: Secondary | ICD-10-CM | POA: Diagnosis not present

## 2021-06-15 DIAGNOSIS — G4700 Insomnia, unspecified: Secondary | ICD-10-CM | POA: Diagnosis not present

## 2021-06-15 DIAGNOSIS — E039 Hypothyroidism, unspecified: Secondary | ICD-10-CM | POA: Diagnosis not present

## 2021-06-23 DIAGNOSIS — L661 Lichen planopilaris: Secondary | ICD-10-CM | POA: Diagnosis not present

## 2021-06-23 DIAGNOSIS — L218 Other seborrheic dermatitis: Secondary | ICD-10-CM | POA: Diagnosis not present

## 2021-06-23 DIAGNOSIS — Z23 Encounter for immunization: Secondary | ICD-10-CM | POA: Diagnosis not present

## 2021-06-23 DIAGNOSIS — Z79899 Other long term (current) drug therapy: Secondary | ICD-10-CM | POA: Diagnosis not present

## 2021-09-28 DIAGNOSIS — M9901 Segmental and somatic dysfunction of cervical region: Secondary | ICD-10-CM | POA: Diagnosis not present

## 2021-09-28 DIAGNOSIS — M530 Cervicocranial syndrome: Secondary | ICD-10-CM | POA: Diagnosis not present

## 2021-09-29 DIAGNOSIS — M530 Cervicocranial syndrome: Secondary | ICD-10-CM | POA: Diagnosis not present

## 2021-09-29 DIAGNOSIS — M9901 Segmental and somatic dysfunction of cervical region: Secondary | ICD-10-CM | POA: Diagnosis not present

## 2021-10-04 DIAGNOSIS — M530 Cervicocranial syndrome: Secondary | ICD-10-CM | POA: Diagnosis not present

## 2021-10-04 DIAGNOSIS — M9901 Segmental and somatic dysfunction of cervical region: Secondary | ICD-10-CM | POA: Diagnosis not present

## 2021-10-06 DIAGNOSIS — M530 Cervicocranial syndrome: Secondary | ICD-10-CM | POA: Diagnosis not present

## 2021-10-06 DIAGNOSIS — M9901 Segmental and somatic dysfunction of cervical region: Secondary | ICD-10-CM | POA: Diagnosis not present

## 2021-10-08 DIAGNOSIS — M9901 Segmental and somatic dysfunction of cervical region: Secondary | ICD-10-CM | POA: Diagnosis not present

## 2021-10-08 DIAGNOSIS — M530 Cervicocranial syndrome: Secondary | ICD-10-CM | POA: Diagnosis not present

## 2021-10-11 DIAGNOSIS — M530 Cervicocranial syndrome: Secondary | ICD-10-CM | POA: Diagnosis not present

## 2021-10-11 DIAGNOSIS — M9901 Segmental and somatic dysfunction of cervical region: Secondary | ICD-10-CM | POA: Diagnosis not present

## 2021-10-13 DIAGNOSIS — M530 Cervicocranial syndrome: Secondary | ICD-10-CM | POA: Diagnosis not present

## 2021-10-13 DIAGNOSIS — M9901 Segmental and somatic dysfunction of cervical region: Secondary | ICD-10-CM | POA: Diagnosis not present

## 2021-10-14 DIAGNOSIS — M530 Cervicocranial syndrome: Secondary | ICD-10-CM | POA: Diagnosis not present

## 2021-10-14 DIAGNOSIS — M9901 Segmental and somatic dysfunction of cervical region: Secondary | ICD-10-CM | POA: Diagnosis not present

## 2021-10-18 DIAGNOSIS — M530 Cervicocranial syndrome: Secondary | ICD-10-CM | POA: Diagnosis not present

## 2021-10-18 DIAGNOSIS — M9901 Segmental and somatic dysfunction of cervical region: Secondary | ICD-10-CM | POA: Diagnosis not present

## 2021-10-20 DIAGNOSIS — M9901 Segmental and somatic dysfunction of cervical region: Secondary | ICD-10-CM | POA: Diagnosis not present

## 2021-10-20 DIAGNOSIS — M530 Cervicocranial syndrome: Secondary | ICD-10-CM | POA: Diagnosis not present

## 2021-10-25 DIAGNOSIS — M530 Cervicocranial syndrome: Secondary | ICD-10-CM | POA: Diagnosis not present

## 2021-10-25 DIAGNOSIS — M9901 Segmental and somatic dysfunction of cervical region: Secondary | ICD-10-CM | POA: Diagnosis not present

## 2021-11-01 DIAGNOSIS — M9901 Segmental and somatic dysfunction of cervical region: Secondary | ICD-10-CM | POA: Diagnosis not present

## 2021-11-01 DIAGNOSIS — M530 Cervicocranial syndrome: Secondary | ICD-10-CM | POA: Diagnosis not present

## 2021-11-08 DIAGNOSIS — M9901 Segmental and somatic dysfunction of cervical region: Secondary | ICD-10-CM | POA: Diagnosis not present

## 2021-11-08 DIAGNOSIS — M530 Cervicocranial syndrome: Secondary | ICD-10-CM | POA: Diagnosis not present

## 2021-11-22 DIAGNOSIS — M530 Cervicocranial syndrome: Secondary | ICD-10-CM | POA: Diagnosis not present

## 2021-11-22 DIAGNOSIS — M9901 Segmental and somatic dysfunction of cervical region: Secondary | ICD-10-CM | POA: Diagnosis not present

## 2021-11-30 DIAGNOSIS — Z0000 Encounter for general adult medical examination without abnormal findings: Secondary | ICD-10-CM | POA: Diagnosis not present

## 2021-11-30 DIAGNOSIS — E039 Hypothyroidism, unspecified: Secondary | ICD-10-CM | POA: Diagnosis not present

## 2021-11-30 DIAGNOSIS — Z1389 Encounter for screening for other disorder: Secondary | ICD-10-CM | POA: Diagnosis not present

## 2021-11-30 DIAGNOSIS — N183 Chronic kidney disease, stage 3 unspecified: Secondary | ICD-10-CM | POA: Diagnosis not present

## 2021-11-30 DIAGNOSIS — G4700 Insomnia, unspecified: Secondary | ICD-10-CM | POA: Diagnosis not present

## 2021-11-30 DIAGNOSIS — E7800 Pure hypercholesterolemia, unspecified: Secondary | ICD-10-CM | POA: Diagnosis not present

## 2021-11-30 DIAGNOSIS — G248 Other dystonia: Secondary | ICD-10-CM | POA: Diagnosis not present

## 2021-11-30 DIAGNOSIS — Z23 Encounter for immunization: Secondary | ICD-10-CM | POA: Diagnosis not present

## 2021-12-07 DIAGNOSIS — M9901 Segmental and somatic dysfunction of cervical region: Secondary | ICD-10-CM | POA: Diagnosis not present

## 2021-12-07 DIAGNOSIS — M530 Cervicocranial syndrome: Secondary | ICD-10-CM | POA: Diagnosis not present

## 2021-12-15 DIAGNOSIS — H1045 Other chronic allergic conjunctivitis: Secondary | ICD-10-CM | POA: Diagnosis not present

## 2021-12-21 DIAGNOSIS — M9901 Segmental and somatic dysfunction of cervical region: Secondary | ICD-10-CM | POA: Diagnosis not present

## 2021-12-21 DIAGNOSIS — M530 Cervicocranial syndrome: Secondary | ICD-10-CM | POA: Diagnosis not present

## 2021-12-22 DIAGNOSIS — D485 Neoplasm of uncertain behavior of skin: Secondary | ICD-10-CM | POA: Diagnosis not present

## 2021-12-22 DIAGNOSIS — L280 Lichen simplex chronicus: Secondary | ICD-10-CM | POA: Diagnosis not present

## 2021-12-22 DIAGNOSIS — L661 Lichen planopilaris: Secondary | ICD-10-CM | POA: Diagnosis not present

## 2021-12-22 DIAGNOSIS — L433 Subacute (active) lichen planus: Secondary | ICD-10-CM | POA: Diagnosis not present

## 2021-12-22 DIAGNOSIS — D225 Melanocytic nevi of trunk: Secondary | ICD-10-CM | POA: Diagnosis not present

## 2021-12-22 DIAGNOSIS — Z79899 Other long term (current) drug therapy: Secondary | ICD-10-CM | POA: Diagnosis not present

## 2021-12-22 DIAGNOSIS — L738 Other specified follicular disorders: Secondary | ICD-10-CM | POA: Diagnosis not present

## 2022-01-04 DIAGNOSIS — M530 Cervicocranial syndrome: Secondary | ICD-10-CM | POA: Diagnosis not present

## 2022-01-04 DIAGNOSIS — M9901 Segmental and somatic dysfunction of cervical region: Secondary | ICD-10-CM | POA: Diagnosis not present

## 2022-01-18 DIAGNOSIS — M9901 Segmental and somatic dysfunction of cervical region: Secondary | ICD-10-CM | POA: Diagnosis not present

## 2022-01-18 DIAGNOSIS — M530 Cervicocranial syndrome: Secondary | ICD-10-CM | POA: Diagnosis not present

## 2022-01-25 DIAGNOSIS — M9901 Segmental and somatic dysfunction of cervical region: Secondary | ICD-10-CM | POA: Diagnosis not present

## 2022-01-25 DIAGNOSIS — M530 Cervicocranial syndrome: Secondary | ICD-10-CM | POA: Diagnosis not present

## 2022-02-08 DIAGNOSIS — M9901 Segmental and somatic dysfunction of cervical region: Secondary | ICD-10-CM | POA: Diagnosis not present

## 2022-02-08 DIAGNOSIS — M530 Cervicocranial syndrome: Secondary | ICD-10-CM | POA: Diagnosis not present

## 2022-02-23 DIAGNOSIS — M9901 Segmental and somatic dysfunction of cervical region: Secondary | ICD-10-CM | POA: Diagnosis not present

## 2022-02-23 DIAGNOSIS — M530 Cervicocranial syndrome: Secondary | ICD-10-CM | POA: Diagnosis not present

## 2022-03-07 DIAGNOSIS — G4700 Insomnia, unspecified: Secondary | ICD-10-CM | POA: Diagnosis not present

## 2022-03-07 DIAGNOSIS — E039 Hypothyroidism, unspecified: Secondary | ICD-10-CM | POA: Diagnosis not present

## 2022-03-07 DIAGNOSIS — E785 Hyperlipidemia, unspecified: Secondary | ICD-10-CM | POA: Diagnosis not present

## 2022-03-07 DIAGNOSIS — D899 Disorder involving the immune mechanism, unspecified: Secondary | ICD-10-CM | POA: Diagnosis not present

## 2022-03-07 DIAGNOSIS — Z809 Family history of malignant neoplasm, unspecified: Secondary | ICD-10-CM | POA: Diagnosis not present

## 2022-03-22 IMAGING — MG DIGITAL SCREENING BILAT W/ TOMO W/ CAD
8 series · 8 of 24 positions shown · non-contrast
Comparison: Previous exam(s).

CLINICAL DATA: Screening.

EXAM:
DIGITAL SCREENING BILATERAL MAMMOGRAM WITH TOMO AND CAD

[L CC synth-2D]
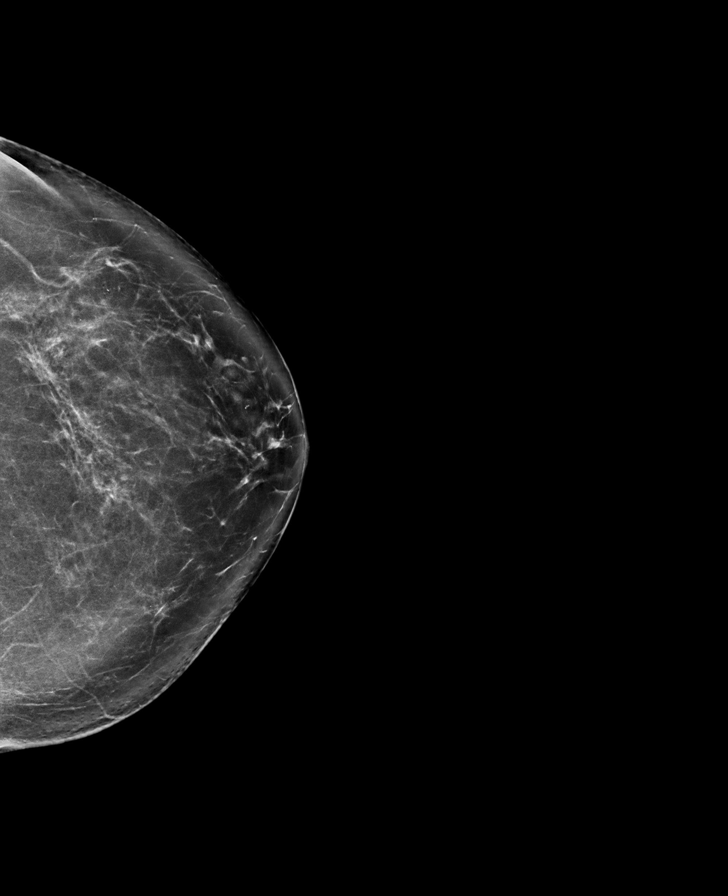

[L MLO synth-2D]
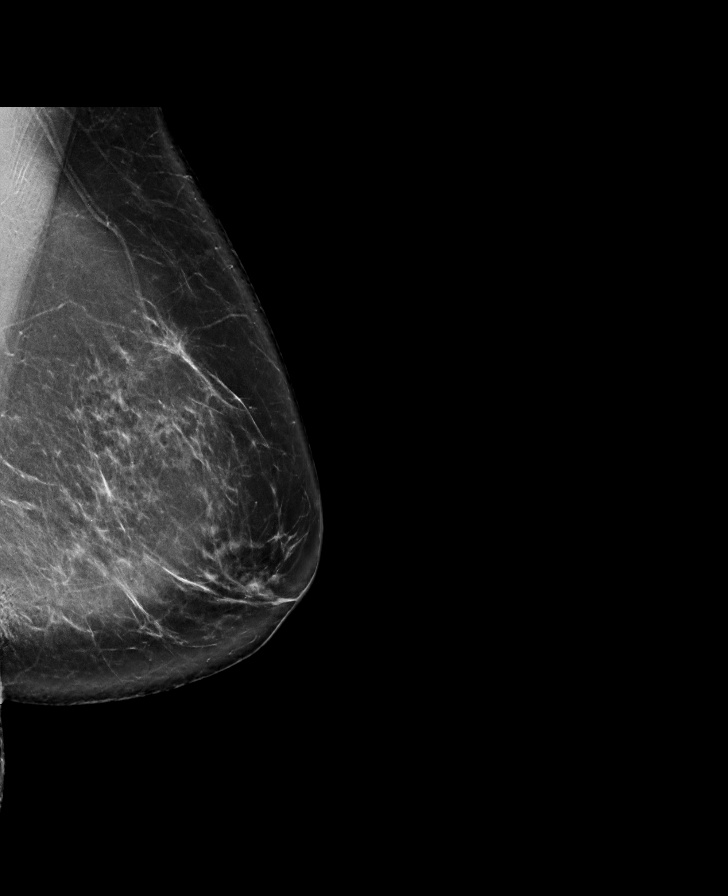

[R CC synth-2D]
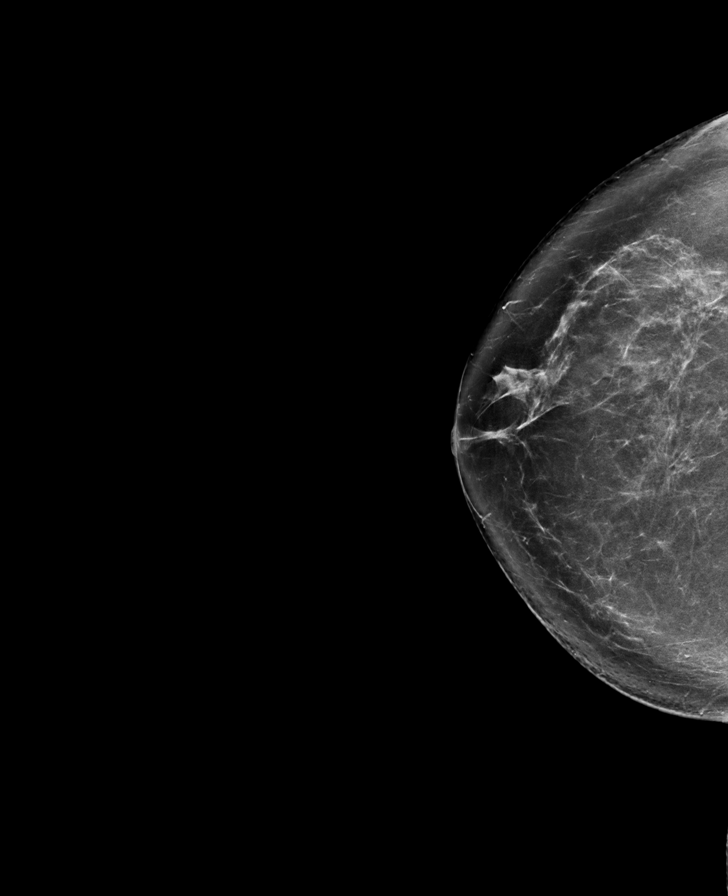

[R MLO synth-2D]
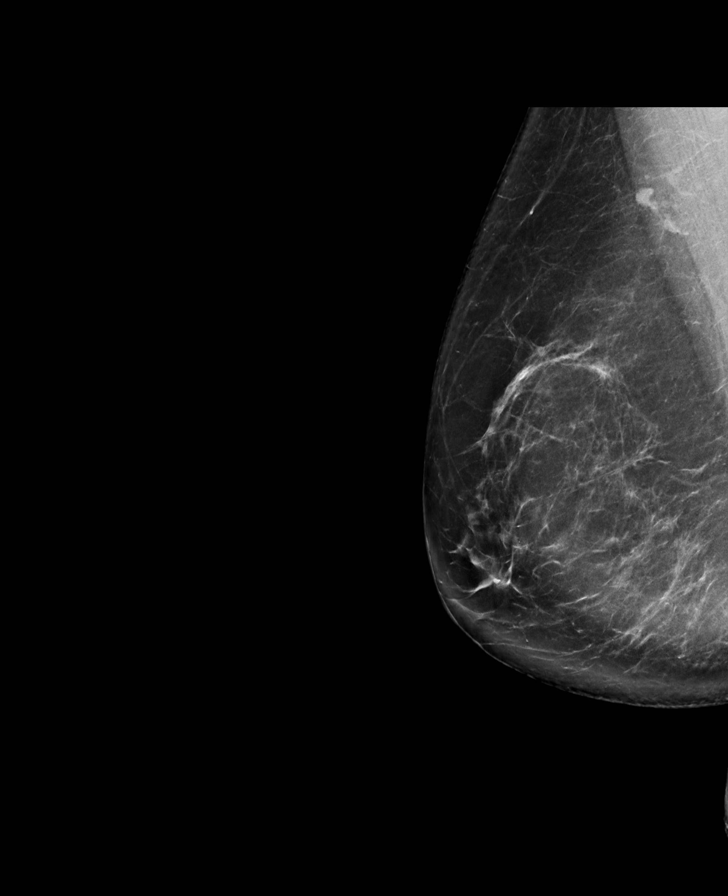

[R CC tomo · tomo slice 43/85.0]
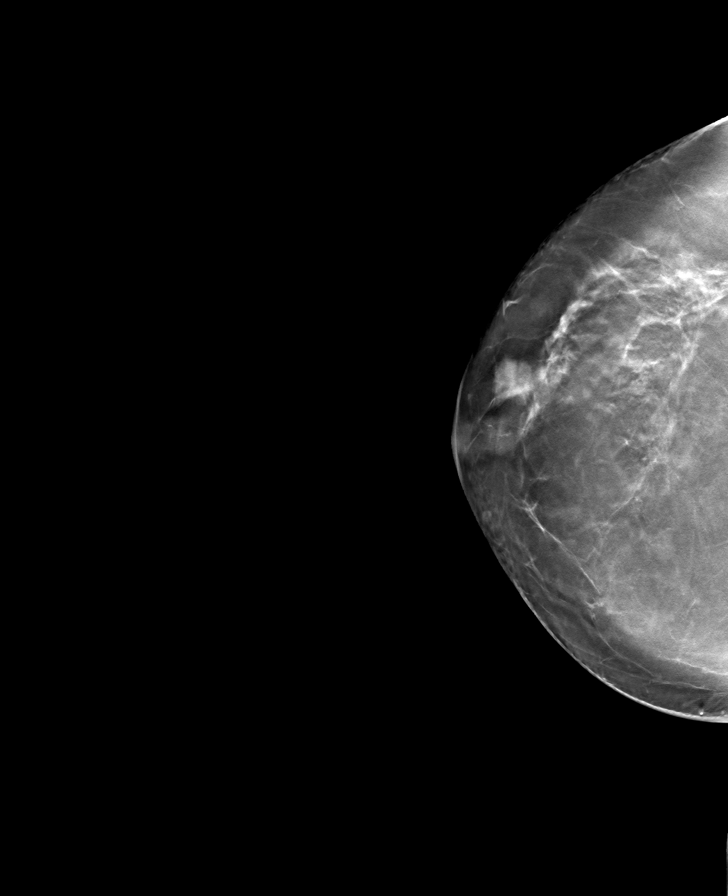

[R MLO tomo · tomo slice 46/91.0]
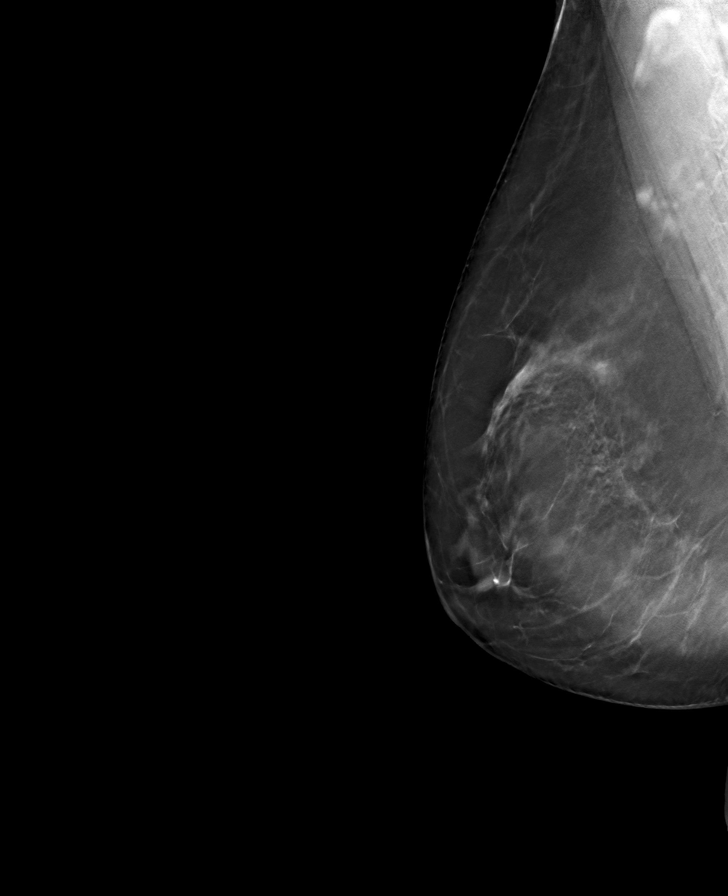

[L MLO tomo · tomo slice 45/89.0]
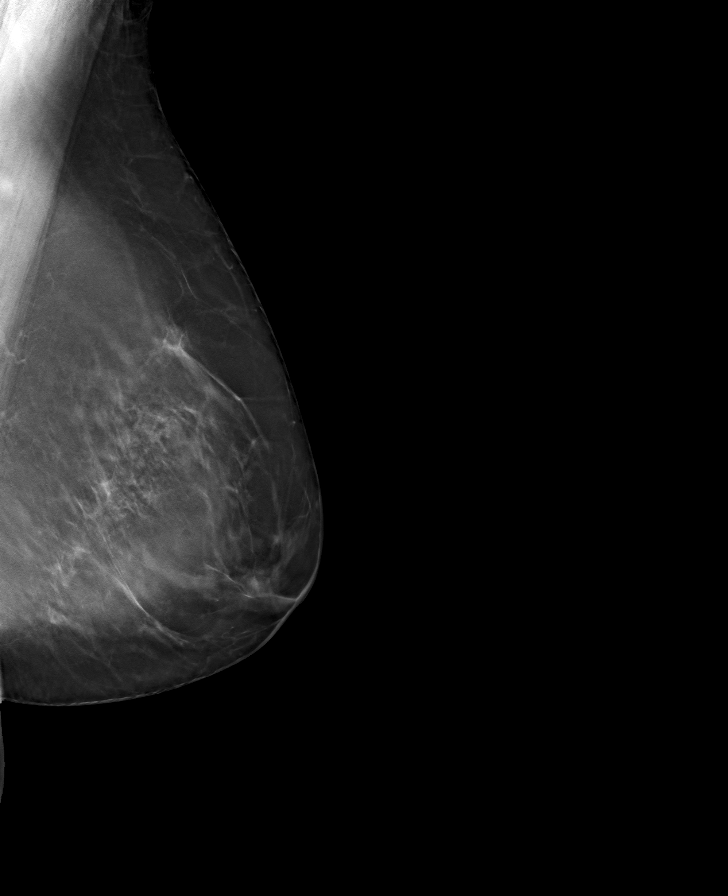

[L CC tomo · tomo slice 41/82.0]
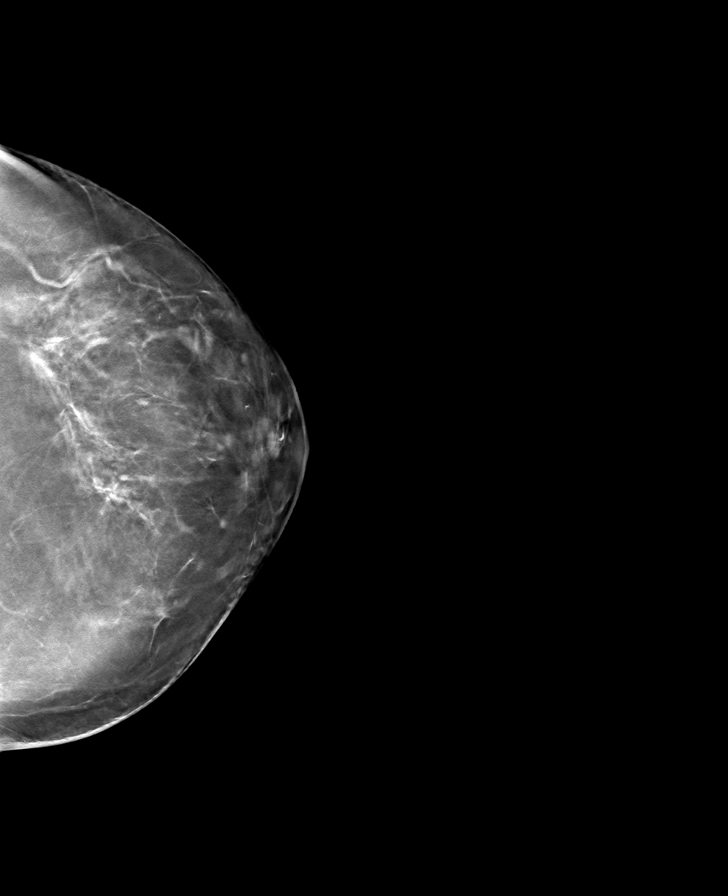

[8 of 24 positions shown; findings below may reference images not displayed]

ACR Breast Density Category b: There are scattered areas of
fibroglandular density.
FINDINGS: There are no findings suspicious for malignancy. Images were
processed with CAD.
IMPRESSION: No mammographic evidence of malignancy. A result letter of this
screening mammogram will be mailed directly to the patient.

RECOMMENDATION:
Screening mammogram in one year. (Code:[id number])

BI-RADS CATEGORY  1: Negative.

## 2022-04-05 ENCOUNTER — Other Ambulatory Visit: Payer: Self-pay | Admitting: Family Medicine

## 2022-04-05 DIAGNOSIS — Z1231 Encounter for screening mammogram for malignant neoplasm of breast: Secondary | ICD-10-CM

## 2022-04-28 ENCOUNTER — Ambulatory Visit
Admission: RE | Admit: 2022-04-28 | Discharge: 2022-04-28 | Disposition: A | Discharge status: Home / Self Care | Payer: Medicare PPO | Source: Ambulatory Visit | Attending: Family Medicine | Admitting: Family Medicine

## 2022-04-28 DIAGNOSIS — Z1231 Encounter for screening mammogram for malignant neoplasm of breast: Secondary | ICD-10-CM

## 2022-05-05 DIAGNOSIS — J309 Allergic rhinitis, unspecified: Secondary | ICD-10-CM | POA: Diagnosis not present

## 2022-05-05 DIAGNOSIS — J069 Acute upper respiratory infection, unspecified: Secondary | ICD-10-CM | POA: Diagnosis not present

## 2022-05-05 DIAGNOSIS — Z13220 Encounter for screening for lipoid disorders: Secondary | ICD-10-CM | POA: Diagnosis not present

## 2022-07-22 DIAGNOSIS — L433 Subacute (active) lichen planus: Secondary | ICD-10-CM | POA: Diagnosis not present

## 2022-07-22 DIAGNOSIS — L661 Lichen planopilaris: Secondary | ICD-10-CM | POA: Diagnosis not present

## 2022-07-22 DIAGNOSIS — Z79899 Other long term (current) drug therapy: Secondary | ICD-10-CM | POA: Diagnosis not present

## 2022-11-29 DIAGNOSIS — M25531 Pain in right wrist: Secondary | ICD-10-CM | POA: Diagnosis not present

## 2022-12-14 DIAGNOSIS — M654 Radial styloid tenosynovitis [de Quervain]: Secondary | ICD-10-CM | POA: Diagnosis not present

## 2022-12-14 DIAGNOSIS — M1811 Unilateral primary osteoarthritis of first carpometacarpal joint, right hand: Secondary | ICD-10-CM | POA: Diagnosis not present

## 2022-12-14 DIAGNOSIS — G5603 Carpal tunnel syndrome, bilateral upper limbs: Secondary | ICD-10-CM | POA: Diagnosis not present

## 2022-12-28 DIAGNOSIS — Z0000 Encounter for general adult medical examination without abnormal findings: Secondary | ICD-10-CM | POA: Diagnosis not present

## 2022-12-28 DIAGNOSIS — Z1331 Encounter for screening for depression: Secondary | ICD-10-CM | POA: Diagnosis not present

## 2022-12-28 DIAGNOSIS — G4700 Insomnia, unspecified: Secondary | ICD-10-CM | POA: Diagnosis not present

## 2022-12-28 DIAGNOSIS — J309 Allergic rhinitis, unspecified: Secondary | ICD-10-CM | POA: Diagnosis not present

## 2022-12-28 DIAGNOSIS — N183 Chronic kidney disease, stage 3 unspecified: Secondary | ICD-10-CM | POA: Diagnosis not present

## 2022-12-28 DIAGNOSIS — E7800 Pure hypercholesterolemia, unspecified: Secondary | ICD-10-CM | POA: Diagnosis not present

## 2022-12-28 DIAGNOSIS — G248 Other dystonia: Secondary | ICD-10-CM | POA: Diagnosis not present

## 2022-12-28 DIAGNOSIS — E039 Hypothyroidism, unspecified: Secondary | ICD-10-CM | POA: Diagnosis not present

## 2023-01-20 DIAGNOSIS — B351 Tinea unguium: Secondary | ICD-10-CM | POA: Diagnosis not present

## 2023-01-20 DIAGNOSIS — L661 Lichen planopilaris: Secondary | ICD-10-CM | POA: Diagnosis not present

## 2023-01-20 DIAGNOSIS — L433 Subacute (active) lichen planus: Secondary | ICD-10-CM | POA: Diagnosis not present

## 2023-02-14 ENCOUNTER — Other Ambulatory Visit: Payer: Self-pay | Admitting: Family Medicine

## 2023-02-14 DIAGNOSIS — Z1231 Encounter for screening mammogram for malignant neoplasm of breast: Secondary | ICD-10-CM

## 2023-02-20 ENCOUNTER — Other Ambulatory Visit: Payer: Self-pay | Admitting: Family Medicine

## 2023-02-20 DIAGNOSIS — E2839 Other primary ovarian failure: Secondary | ICD-10-CM

## 2023-02-23 ENCOUNTER — Ambulatory Visit
Admission: RE | Admit: 2023-02-23 | Discharge: 2023-02-23 | Disposition: A | Discharge status: Home / Self Care | Payer: Medicare PPO | Source: Ambulatory Visit | Attending: Family Medicine | Admitting: Family Medicine

## 2023-02-23 DIAGNOSIS — M810 Age-related osteoporosis without current pathological fracture: Secondary | ICD-10-CM | POA: Diagnosis not present

## 2023-02-23 DIAGNOSIS — E2839 Other primary ovarian failure: Secondary | ICD-10-CM

## 2023-03-28 DIAGNOSIS — U071 COVID-19: Secondary | ICD-10-CM | POA: Diagnosis not present

## 2023-05-08 ENCOUNTER — Ambulatory Visit
Admission: RE | Admit: 2023-05-08 | Discharge: 2023-05-08 | Disposition: A | Discharge status: Home / Self Care | Payer: Medicare PPO | Source: Ambulatory Visit | Attending: Family Medicine | Admitting: Family Medicine

## 2023-05-08 DIAGNOSIS — Z1231 Encounter for screening mammogram for malignant neoplasm of breast: Secondary | ICD-10-CM | POA: Diagnosis not present

## 2023-07-25 DIAGNOSIS — L438 Other lichen planus: Secondary | ICD-10-CM | POA: Diagnosis not present

## 2023-07-25 DIAGNOSIS — Z79899 Other long term (current) drug therapy: Secondary | ICD-10-CM | POA: Diagnosis not present

## 2023-07-25 DIAGNOSIS — L6611 Classic lichen planopilaris: Secondary | ICD-10-CM | POA: Diagnosis not present

## 2023-08-28 DIAGNOSIS — M25511 Pain in right shoulder: Secondary | ICD-10-CM | POA: Diagnosis not present

## 2023-08-28 DIAGNOSIS — M7541 Impingement syndrome of right shoulder: Secondary | ICD-10-CM | POA: Diagnosis not present

## 2023-09-11 ENCOUNTER — Emergency Department (HOSPITAL_COMMUNITY): Payer: Medicare PPO

## 2023-09-11 ENCOUNTER — Other Ambulatory Visit: Payer: Self-pay

## 2023-09-11 ENCOUNTER — Inpatient Hospital Stay (HOSPITAL_COMMUNITY): Payer: Medicare PPO

## 2023-09-11 ENCOUNTER — Encounter (HOSPITAL_COMMUNITY)
Admission: EM | Disposition: A | Discharge status: Home Health | Payer: Self-pay | Source: Home / Self Care | Attending: Surgery

## 2023-09-11 ENCOUNTER — Inpatient Hospital Stay (HOSPITAL_COMMUNITY)
Admission: EM | Admit: 2023-09-11 | Discharge: 2023-09-18 | DRG: 220 | Disposition: A | Discharge status: Home Health | Payer: Medicare PPO | Attending: Surgery | Admitting: Surgery

## 2023-09-11 ENCOUNTER — Encounter (HOSPITAL_COMMUNITY): Payer: Self-pay

## 2023-09-11 ENCOUNTER — Emergency Department (HOSPITAL_COMMUNITY): Payer: Medicare PPO | Admitting: Anesthesiology

## 2023-09-11 DIAGNOSIS — R0789 Other chest pain: Secondary | ICD-10-CM | POA: Diagnosis not present

## 2023-09-11 DIAGNOSIS — R000 Tachycardia, unspecified: Secondary | ICD-10-CM | POA: Diagnosis not present

## 2023-09-11 DIAGNOSIS — Z882 Allergy status to sulfonamides status: Secondary | ICD-10-CM

## 2023-09-11 DIAGNOSIS — Z888 Allergy status to other drugs, medicaments and biological substances status: Secondary | ICD-10-CM

## 2023-09-11 DIAGNOSIS — Z801 Family history of malignant neoplasm of trachea, bronchus and lung: Secondary | ICD-10-CM | POA: Diagnosis not present

## 2023-09-11 DIAGNOSIS — T380X5A Adverse effect of glucocorticoids and synthetic analogues, initial encounter: Secondary | ICD-10-CM | POA: Diagnosis not present

## 2023-09-11 DIAGNOSIS — N189 Chronic kidney disease, unspecified: Secondary | ICD-10-CM

## 2023-09-11 DIAGNOSIS — I719 Aortic aneurysm of unspecified site, without rupture: Secondary | ICD-10-CM | POA: Diagnosis not present

## 2023-09-11 DIAGNOSIS — E039 Hypothyroidism, unspecified: Secondary | ICD-10-CM | POA: Diagnosis not present

## 2023-09-11 DIAGNOSIS — K5900 Constipation, unspecified: Secondary | ICD-10-CM | POA: Diagnosis not present

## 2023-09-11 DIAGNOSIS — Z8619 Personal history of other infectious and parasitic diseases: Secondary | ICD-10-CM | POA: Diagnosis not present

## 2023-09-11 DIAGNOSIS — Q600 Renal agenesis, unilateral: Secondary | ICD-10-CM | POA: Diagnosis not present

## 2023-09-11 DIAGNOSIS — Z9889 Other specified postprocedural states: Secondary | ICD-10-CM | POA: Diagnosis not present

## 2023-09-11 DIAGNOSIS — M797 Fibromyalgia: Secondary | ICD-10-CM | POA: Diagnosis present

## 2023-09-11 DIAGNOSIS — I711 Thoracic aortic aneurysm, ruptured, unspecified: Secondary | ICD-10-CM | POA: Diagnosis not present

## 2023-09-11 DIAGNOSIS — I7101 Dissection of ascending aorta: Secondary | ICD-10-CM | POA: Diagnosis not present

## 2023-09-11 DIAGNOSIS — Z880 Allergy status to penicillin: Secondary | ICD-10-CM | POA: Diagnosis not present

## 2023-09-11 DIAGNOSIS — D72828 Other elevated white blood cell count: Secondary | ICD-10-CM | POA: Diagnosis present

## 2023-09-11 DIAGNOSIS — K5730 Diverticulosis of large intestine without perforation or abscess without bleeding: Secondary | ICD-10-CM | POA: Diagnosis not present

## 2023-09-11 DIAGNOSIS — J9811 Atelectasis: Secondary | ICD-10-CM | POA: Diagnosis not present

## 2023-09-11 DIAGNOSIS — R079 Chest pain, unspecified: Secondary | ICD-10-CM | POA: Diagnosis not present

## 2023-09-11 DIAGNOSIS — J90 Pleural effusion, not elsewhere classified: Secondary | ICD-10-CM | POA: Diagnosis not present

## 2023-09-11 DIAGNOSIS — Z8261 Family history of arthritis: Secondary | ICD-10-CM

## 2023-09-11 DIAGNOSIS — T501X5A Adverse effect of loop [high-ceiling] diuretics, initial encounter: Secondary | ICD-10-CM | POA: Diagnosis not present

## 2023-09-11 DIAGNOSIS — G8929 Other chronic pain: Secondary | ICD-10-CM | POA: Diagnosis present

## 2023-09-11 DIAGNOSIS — Z79890 Hormone replacement therapy (postmenopausal): Secondary | ICD-10-CM

## 2023-09-11 DIAGNOSIS — D6959 Other secondary thrombocytopenia: Secondary | ICD-10-CM | POA: Diagnosis not present

## 2023-09-11 DIAGNOSIS — E7800 Pure hypercholesterolemia, unspecified: Secondary | ICD-10-CM | POA: Diagnosis present

## 2023-09-11 DIAGNOSIS — E876 Hypokalemia: Secondary | ICD-10-CM | POA: Diagnosis not present

## 2023-09-11 DIAGNOSIS — Z7982 Long term (current) use of aspirin: Secondary | ICD-10-CM

## 2023-09-11 DIAGNOSIS — R231 Pallor: Secondary | ICD-10-CM | POA: Diagnosis not present

## 2023-09-11 DIAGNOSIS — M549 Dorsalgia, unspecified: Secondary | ICD-10-CM | POA: Diagnosis present

## 2023-09-11 DIAGNOSIS — R739 Hyperglycemia, unspecified: Secondary | ICD-10-CM | POA: Diagnosis present

## 2023-09-11 DIAGNOSIS — R9389 Abnormal findings on diagnostic imaging of other specified body structures: Secondary | ICD-10-CM | POA: Diagnosis not present

## 2023-09-11 DIAGNOSIS — I700 Atherosclerosis of aorta: Secondary | ICD-10-CM | POA: Diagnosis not present

## 2023-09-11 DIAGNOSIS — R0902 Hypoxemia: Secondary | ICD-10-CM | POA: Diagnosis not present

## 2023-09-11 DIAGNOSIS — I7100 Dissection of unspecified site of aorta: Secondary | ICD-10-CM | POA: Diagnosis not present

## 2023-09-11 DIAGNOSIS — R001 Bradycardia, unspecified: Secondary | ICD-10-CM | POA: Diagnosis not present

## 2023-09-11 DIAGNOSIS — J939 Pneumothorax, unspecified: Secondary | ICD-10-CM | POA: Diagnosis not present

## 2023-09-11 DIAGNOSIS — I493 Ventricular premature depolarization: Secondary | ICD-10-CM | POA: Diagnosis not present

## 2023-09-11 DIAGNOSIS — Z79899 Other long term (current) drug therapy: Secondary | ICD-10-CM

## 2023-09-11 DIAGNOSIS — I10 Essential (primary) hypertension: Secondary | ICD-10-CM | POA: Diagnosis present

## 2023-09-11 DIAGNOSIS — I71019 Dissection of thoracic aorta, unspecified: Secondary | ICD-10-CM | POA: Diagnosis present

## 2023-09-11 DIAGNOSIS — Z48812 Encounter for surgical aftercare following surgery on the circulatory system: Secondary | ICD-10-CM | POA: Diagnosis not present

## 2023-09-11 DIAGNOSIS — D62 Acute posthemorrhagic anemia: Secondary | ICD-10-CM | POA: Diagnosis not present

## 2023-09-11 DIAGNOSIS — Z4682 Encounter for fitting and adjustment of non-vascular catheter: Secondary | ICD-10-CM | POA: Diagnosis not present

## 2023-09-11 DIAGNOSIS — I2510 Atherosclerotic heart disease of native coronary artery without angina pectoris: Secondary | ICD-10-CM | POA: Diagnosis not present

## 2023-09-11 DIAGNOSIS — R5381 Other malaise: Secondary | ICD-10-CM | POA: Diagnosis not present

## 2023-09-11 DIAGNOSIS — I959 Hypotension, unspecified: Secondary | ICD-10-CM | POA: Diagnosis not present

## 2023-09-11 HISTORY — PX: REPAIR OF ACUTE ASCENDING THORACIC AORTIC DISSECTION: SHX6323

## 2023-09-11 HISTORY — PX: INTRAOPERATIVE TRANSESOPHAGEAL ECHOCARDIOGRAM: SHX5062

## 2023-09-11 LAB — BASIC METABOLIC PANEL WITH GFR
Anion gap: 6 (ref 5–15)
BUN: 16 mg/dL (ref 8–23)
CO2: 25 mmol/L (ref 22–32)
Calcium: 8.8 mg/dL — ABNORMAL LOW (ref 8.9–10.3)
Chloride: 110 mmol/L (ref 98–111)
Creatinine, Ser: 1.36 mg/dL — ABNORMAL HIGH (ref 0.44–1.00)
GFR, Estimated: 41 mL/min — ABNORMAL LOW
Glucose, Bld: 114 mg/dL — ABNORMAL HIGH (ref 70–99)
Potassium: 4.4 mmol/L (ref 3.5–5.1)
Sodium: 141 mmol/L (ref 135–145)

## 2023-09-11 LAB — CBC WITH DIFFERENTIAL/PLATELET
Abs Immature Granulocytes: 0.04 10*3/uL (ref 0.00–0.07)
Basophils Absolute: 0.1 10*3/uL (ref 0.0–0.1)
Basophils Relative: 0 %
Eosinophils Absolute: 0.1 10*3/uL (ref 0.0–0.5)
Eosinophils Relative: 1 %
HCT: 40.1 % (ref 36.0–46.0)
Hemoglobin: 13.5 g/dL (ref 12.0–15.0)
Immature Granulocytes: 0 %
Lymphocytes Relative: 20 %
Lymphs Abs: 2.2 10*3/uL (ref 0.7–4.0)
MCH: 30.8 pg (ref 26.0–34.0)
MCHC: 33.7 g/dL (ref 30.0–36.0)
MCV: 91.6 fL (ref 80.0–100.0)
Monocytes Absolute: 0.7 10*3/uL (ref 0.1–1.0)
Monocytes Relative: 6 %
Neutro Abs: 8.1 10*3/uL — ABNORMAL HIGH (ref 1.7–7.7)
Neutrophils Relative %: 73 %
Platelets: 155 10*3/uL (ref 150–400)
RBC: 4.38 MIL/uL (ref 3.87–5.11)
RDW: 12.9 % (ref 11.5–15.5)
WBC: 11.2 10*3/uL — ABNORMAL HIGH (ref 4.0–10.5)
nRBC: 0 % (ref 0.0–0.2)

## 2023-09-11 LAB — POCT I-STAT, CHEM 8
BUN: 16 mg/dL (ref 8–23)
BUN: 14 mg/dL (ref 8–23)
BUN: 15 mg/dL (ref 8–23)
BUN: 16 mg/dL (ref 8–23)
BUN: 16 mg/dL (ref 8–23)
Calcium, Ion: 1.22 mmol/L (ref 1.15–1.40)
Calcium, Ion: 1.17 mmol/L (ref 1.15–1.40)
Calcium, Ion: 1.01 mmol/L — ABNORMAL LOW (ref 1.15–1.40)
Calcium, Ion: 0.95 mmol/L — ABNORMAL LOW (ref 1.15–1.40)
Calcium, Ion: 1.3 mmol/L (ref 1.15–1.40)
Chloride: 107 mmol/L (ref 98–111)
Chloride: 106 mmol/L (ref 98–111)
Chloride: 102 mmol/L (ref 98–111)
Chloride: 102 mmol/L (ref 98–111)
Chloride: 104 mmol/L (ref 98–111)
Creatinine, Ser: 0.8 mg/dL (ref 0.44–1.00)
Creatinine, Ser: 0.8 mg/dL (ref 0.44–1.00)
Creatinine, Ser: 0.7 mg/dL (ref 0.44–1.00)
Creatinine, Ser: 0.7 mg/dL (ref 0.44–1.00)
Creatinine, Ser: 0.7 mg/dL (ref 0.44–1.00)
Glucose, Bld: 130 mg/dL — ABNORMAL HIGH (ref 70–99)
Glucose, Bld: 129 mg/dL — ABNORMAL HIGH (ref 70–99)
Glucose, Bld: 121 mg/dL — ABNORMAL HIGH (ref 70–99)
Glucose, Bld: 173 mg/dL — ABNORMAL HIGH (ref 70–99)
Glucose, Bld: 163 mg/dL — ABNORMAL HIGH (ref 70–99)
HCT: 34 % — ABNORMAL LOW (ref 36.0–46.0)
HCT: 29 % — ABNORMAL LOW (ref 36.0–46.0)
HCT: 25 % — ABNORMAL LOW (ref 36.0–46.0)
HCT: 22 % — ABNORMAL LOW (ref 36.0–46.0)
HCT: 24 % — ABNORMAL LOW (ref 36.0–46.0)
Hemoglobin: 11.6 g/dL — ABNORMAL LOW (ref 12.0–15.0)
Hemoglobin: 9.9 g/dL — ABNORMAL LOW (ref 12.0–15.0)
Hemoglobin: 8.5 g/dL — ABNORMAL LOW (ref 12.0–15.0)
Hemoglobin: 7.5 g/dL — ABNORMAL LOW (ref 12.0–15.0)
Hemoglobin: 8.2 g/dL — ABNORMAL LOW (ref 12.0–15.0)
Potassium: 4 mmol/L (ref 3.5–5.1)
Potassium: 3.6 mmol/L (ref 3.5–5.1)
Potassium: 3.7 mmol/L (ref 3.5–5.1)
Potassium: 4.3 mmol/L (ref 3.5–5.1)
Potassium: 4.1 mmol/L (ref 3.5–5.1)
Sodium: 141 mmol/L (ref 135–145)
Sodium: 142 mmol/L (ref 135–145)
Sodium: 142 mmol/L (ref 135–145)
Sodium: 137 mmol/L (ref 135–145)
Sodium: 139 mmol/L (ref 135–145)
TCO2: 24 mmol/L (ref 22–32)
TCO2: 21 mmol/L — ABNORMAL LOW (ref 22–32)
TCO2: 27 mmol/L (ref 22–32)
TCO2: 25 mmol/L (ref 22–32)
TCO2: 25 mmol/L (ref 22–32)

## 2023-09-11 LAB — POCT I-STAT EG7
Acid-base deficit: 1 mmol/L (ref 0.0–2.0)
Bicarbonate: 23.1 mmol/L (ref 20.0–28.0)
Calcium, Ion: 0.97 mmol/L — ABNORMAL LOW (ref 1.15–1.40)
HCT: 25 % — ABNORMAL LOW (ref 36.0–46.0)
Hemoglobin: 8.5 g/dL — ABNORMAL LOW (ref 12.0–15.0)
O2 Saturation: 84 %
Potassium: 4.2 mmol/L (ref 3.5–5.1)
Sodium: 142 mmol/L (ref 135–145)
TCO2: 24 mmol/L (ref 22–32)
pCO2, Ven: 34.9 mm[Hg] — ABNORMAL LOW (ref 44–60)
pH, Ven: 7.428 (ref 7.25–7.43)
pO2, Ven: 47 mm[Hg] — ABNORMAL HIGH (ref 32–45)

## 2023-09-11 LAB — POCT I-STAT 7, (LYTES, BLD GAS, ICA,H+H)
Acid-Base Excess: 0 mmol/L (ref 0.0–2.0)
Acid-Base Excess: 1 mmol/L (ref 0.0–2.0)
Acid-base deficit: 2 mmol/L (ref 0.0–2.0)
Acid-base deficit: 3 mmol/L — ABNORMAL HIGH (ref 0.0–2.0)
Acid-base deficit: 2 mmol/L (ref 0.0–2.0)
Bicarbonate: 21.8 mmol/L (ref 20.0–28.0)
Bicarbonate: 22.7 mmol/L (ref 20.0–28.0)
Bicarbonate: 22.2 mmol/L (ref 20.0–28.0)
Bicarbonate: 23.4 mmol/L (ref 20.0–28.0)
Bicarbonate: 24.7 mmol/L (ref 20.0–28.0)
Calcium, Ion: 0.98 mmol/L — ABNORMAL LOW (ref 1.15–1.40)
Calcium, Ion: 0.95 mmol/L — ABNORMAL LOW (ref 1.15–1.40)
Calcium, Ion: 1.31 mmol/L (ref 1.15–1.40)
Calcium, Ion: 0.95 mmol/L — ABNORMAL LOW (ref 1.15–1.40)
Calcium, Ion: 1.04 mmol/L — ABNORMAL LOW (ref 1.15–1.40)
HCT: 26 % — ABNORMAL LOW (ref 36.0–46.0)
HCT: 24 % — ABNORMAL LOW (ref 36.0–46.0)
HCT: 21 % — ABNORMAL LOW (ref 36.0–46.0)
HCT: 20 % — ABNORMAL LOW (ref 36.0–46.0)
HCT: 25 % — ABNORMAL LOW (ref 36.0–46.0)
Hemoglobin: 8.8 g/dL — ABNORMAL LOW (ref 12.0–15.0)
Hemoglobin: 8.2 g/dL — ABNORMAL LOW (ref 12.0–15.0)
Hemoglobin: 7.1 g/dL — ABNORMAL LOW (ref 12.0–15.0)
Hemoglobin: 6.8 g/dL — CL (ref 12.0–15.0)
Hemoglobin: 8.5 g/dL — ABNORMAL LOW (ref 12.0–15.0)
O2 Saturation: 100 %
O2 Saturation: 100 %
O2 Saturation: 100 %
O2 Saturation: 99 %
O2 Saturation: 98 %
Patient temperature: 36
Potassium: 4.1 mmol/L (ref 3.5–5.1)
Potassium: 3.5 mmol/L (ref 3.5–5.1)
Potassium: 4.1 mmol/L (ref 3.5–5.1)
Potassium: 3.6 mmol/L (ref 3.5–5.1)
Potassium: 3.6 mmol/L (ref 3.5–5.1)
Sodium: 141 mmol/L (ref 135–145)
Sodium: 135 mmol/L (ref 135–145)
Sodium: 139 mmol/L (ref 135–145)
Sodium: 142 mmol/L (ref 135–145)
Sodium: 142 mmol/L (ref 135–145)
TCO2: 23 mmol/L (ref 22–32)
TCO2: 24 mmol/L (ref 22–32)
TCO2: 23 mmol/L (ref 22–32)
TCO2: 24 mmol/L (ref 22–32)
TCO2: 26 mmol/L (ref 22–32)
pCO2 arterial: 32.5 mm[Hg] (ref 32–48)
pCO2 arterial: 41.1 mm[Hg] (ref 32–48)
pCO2 arterial: 31.8 mm[Hg] — ABNORMAL LOW (ref 32–48)
pCO2 arterial: 31.8 mm[Hg] — ABNORMAL LOW (ref 32–48)
pCO2 arterial: 35.2 mm[Hg] (ref 32–48)
pH, Arterial: 7.434 (ref 7.35–7.45)
pH, Arterial: 7.351 (ref 7.35–7.45)
pH, Arterial: 7.453 — ABNORMAL HIGH (ref 7.35–7.45)
pH, Arterial: 7.475 — ABNORMAL HIGH (ref 7.35–7.45)
pH, Arterial: 7.451 — ABNORMAL HIGH (ref 7.35–7.45)
pO2, Arterial: 403 mm[Hg] — ABNORMAL HIGH (ref 83–108)
pO2, Arterial: 489 mm[Hg] — ABNORMAL HIGH (ref 83–108)
pO2, Arterial: 214 mm[Hg] — ABNORMAL HIGH (ref 83–108)
pO2, Arterial: 128 mm[Hg] — ABNORMAL HIGH (ref 83–108)
pO2, Arterial: 99 mm[Hg] (ref 83–108)

## 2023-09-11 LAB — PLATELET COUNT: Platelets: 83 10*3/uL — ABNORMAL LOW (ref 150–400)

## 2023-09-11 LAB — PREPARE RBC (CROSSMATCH)

## 2023-09-11 LAB — CBC
HCT: 29.1 % — ABNORMAL LOW (ref 36.0–46.0)
Hemoglobin: 9.9 g/dL — ABNORMAL LOW (ref 12.0–15.0)
MCH: 30.2 pg (ref 26.0–34.0)
MCHC: 34 g/dL (ref 30.0–36.0)
MCV: 88.7 fL (ref 80.0–100.0)
Platelets: 120 10*3/uL — ABNORMAL LOW (ref 150–400)
RBC: 3.28 MIL/uL — ABNORMAL LOW (ref 3.87–5.11)
RDW: 13.6 % (ref 11.5–15.5)
WBC: 9.1 10*3/uL (ref 4.0–10.5)
nRBC: 0 % (ref 0.0–0.2)

## 2023-09-11 LAB — PROTIME-INR
INR: 1.4 — ABNORMAL HIGH (ref 0.8–1.2)
Prothrombin Time: 17.1 s — ABNORMAL HIGH (ref 11.4–15.2)

## 2023-09-11 LAB — MAGNESIUM: Magnesium: 2 mg/dL (ref 1.7–2.4)

## 2023-09-11 LAB — HEPATIC FUNCTION PANEL
ALT: 21 U/L (ref 0–44)
AST: 24 U/L (ref 15–41)
Albumin: 3.6 g/dL (ref 3.5–5.0)
Alkaline Phosphatase: 61 U/L (ref 38–126)
Bilirubin, Direct: 0.1 mg/dL (ref 0.0–0.2)
Indirect Bilirubin: 0.7 mg/dL (ref 0.3–0.9)
Total Bilirubin: 0.8 mg/dL
Total Protein: 5.9 g/dL — ABNORMAL LOW (ref 6.5–8.1)

## 2023-09-11 LAB — I-STAT CHEM 8, ED
BUN: 18 mg/dL (ref 8–23)
Calcium, Ion: 1.11 mmol/L — ABNORMAL LOW (ref 1.15–1.40)
Chloride: 108 mmol/L (ref 98–111)
Creatinine, Ser: 1.1 mg/dL — ABNORMAL HIGH (ref 0.44–1.00)
Glucose, Bld: 108 mg/dL — ABNORMAL HIGH (ref 70–99)
HCT: 38 % (ref 36.0–46.0)
Hemoglobin: 12.9 g/dL (ref 12.0–15.0)
Potassium: 4.4 mmol/L (ref 3.5–5.1)
Sodium: 144 mmol/L (ref 135–145)
TCO2: 24 mmol/L (ref 22–32)

## 2023-09-11 LAB — APTT: aPTT: 36 s (ref 24–36)

## 2023-09-11 LAB — FIBRINOGEN: Fibrinogen: 168 mg/dL — ABNORMAL LOW (ref 210–475)

## 2023-09-11 LAB — TROPONIN I (HIGH SENSITIVITY)
Troponin I (High Sensitivity): 4 ng/L (ref <18)
Troponin I (High Sensitivity): 4 ng/L (ref <18)

## 2023-09-11 LAB — HEMOGLOBIN AND HEMATOCRIT, BLOOD
HCT: 23.5 % — ABNORMAL LOW (ref 36.0–46.0)
Hemoglobin: 8 g/dL — ABNORMAL LOW (ref 12.0–15.0)

## 2023-09-11 LAB — LIPASE, BLOOD: Lipase: 33 U/L (ref 11–51)

## 2023-09-11 LAB — CBG MONITORING, ED: Glucose-Capillary: 100 mg/dL — ABNORMAL HIGH (ref 70–99)

## 2023-09-11 LAB — GLUCOSE, CAPILLARY: Glucose-Capillary: 142 mg/dL — ABNORMAL HIGH (ref 70–99)

## 2023-09-11 LAB — ABO/RH: ABO/RH(D): A NEG

## 2023-09-11 SURGERY — REPAIR, AORTIC DISSECTION, ASCENDING
Anesthesia: General | Site: Esophagus

## 2023-09-11 MED ORDER — CEFAZOLIN SODIUM-DEXTROSE 2-4 GM/100ML-% IV SOLN
2.0000 g | INTRAVENOUS | Status: AC
  Administered 2023-09-11: 2 g via INTRAVENOUS
  Filled 2023-09-11: qty 100

## 2023-09-11 MED ORDER — DEXMEDETOMIDINE HCL IN NACL 400 MCG/100ML IV SOLN
0.1000 ug/kg/h | INTRAVENOUS | Status: AC
  Administered 2023-09-11: .7 ug/kg/h via INTRAVENOUS
  Filled 2023-09-11: qty 100

## 2023-09-11 MED ORDER — TRANEXAMIC ACID (OHS) PUMP PRIME SOLUTION
2.0000 mg/kg | INTRAVENOUS | Status: DC
  Filled 2023-09-11: qty 1.26

## 2023-09-11 MED ORDER — LACTATED RINGERS IV SOLN: INTRAVENOUS | Status: AC

## 2023-09-11 MED ORDER — SODIUM CHLORIDE 0.9% IV SOLUTION: Freq: Once | INTRAVENOUS | Status: AC

## 2023-09-11 MED ORDER — HYDROMORPHONE HCL 1 MG/ML IJ SOLN
0.5000 mg | Freq: Once | INTRAMUSCULAR | Status: AC
  Administered 2023-09-11: 0.5 mg via INTRAVENOUS
  Filled 2023-09-11: qty 1

## 2023-09-11 MED ORDER — ASPIRIN 325 MG PO TBEC: 325.0000 mg | DELAYED_RELEASE_TABLET | Freq: Every day | ORAL | Status: DC

## 2023-09-11 MED ORDER — ETOMIDATE 2 MG/ML IV SOLN
INTRAVENOUS | Status: AC
  Filled 2023-09-11: qty 10

## 2023-09-11 MED ORDER — FENTANYL CITRATE (PF) 250 MCG/5ML IJ SOLN
INTRAMUSCULAR | Status: AC
  Filled 2023-09-11: qty 5

## 2023-09-11 MED ORDER — TRAMADOL HCL 50 MG PO TABS
50.0000 mg | ORAL_TABLET | ORAL | Status: DC | PRN
Start: 2023-09-11 — End: 2023-09-12

## 2023-09-11 MED ORDER — MAGNESIUM SULFATE 50 % IJ SOLN
40.0000 meq | INTRAMUSCULAR | Status: DC
  Filled 2023-09-11: qty 9.85

## 2023-09-11 MED ORDER — MAGNESIUM SULFATE 4 GM/100ML IV SOLN
4.0000 g | Freq: Once | INTRAVENOUS | Status: AC
  Administered 2023-09-11: 4 g via INTRAVENOUS
  Filled 2023-09-11: qty 100

## 2023-09-11 MED ORDER — FENTANYL CITRATE (PF) 250 MCG/5ML IJ SOLN
INTRAMUSCULAR | Status: DC | PRN
  Administered 2023-09-11: 50 ug via INTRAVENOUS
  Administered 2023-09-11: 100 ug via INTRAVENOUS
  Administered 2023-09-11: 50 ug via INTRAVENOUS
  Administered 2023-09-11: 100 ug via INTRAVENOUS
  Administered 2023-09-11: 50 ug via INTRAVENOUS
  Administered 2023-09-11: 100 ug via INTRAVENOUS
  Administered 2023-09-11: 50 ug via INTRAVENOUS
  Administered 2023-09-11: 200 ug via INTRAVENOUS
  Administered 2023-09-11: 50 ug via INTRAVENOUS
  Administered 2023-09-11: 100 ug via INTRAVENOUS

## 2023-09-11 MED ORDER — IOHEXOL 350 MG/ML SOLN
75.0000 mL | Freq: Once | INTRAVENOUS | Status: AC | PRN
  Administered 2023-09-11: 75 mL via INTRAVENOUS

## 2023-09-11 MED ORDER — SODIUM CHLORIDE 0.9 % IV SOLN
20.0000 ug | Freq: Once | INTRAVENOUS | Status: AC
  Administered 2023-09-12: 20 ug via INTRAVENOUS
  Filled 2023-09-11: qty 5

## 2023-09-11 MED ORDER — BISACODYL 10 MG RE SUPP: 10.0000 mg | Freq: Every day | RECTAL | Status: DC

## 2023-09-11 MED ORDER — MANNITOL 20 % IV SOLN
INTRAVENOUS | Status: DC
  Filled 2023-09-11 (×2): qty 13

## 2023-09-11 MED ORDER — PHENYLEPHRINE 80 MCG/ML (10ML) SYRINGE FOR IV PUSH (FOR BLOOD PRESSURE SUPPORT)
PREFILLED_SYRINGE | INTRAVENOUS | Status: AC
  Filled 2023-09-11: qty 10

## 2023-09-11 MED ORDER — LACTATED RINGERS IV BOLUS
1000.0000 mL | Freq: Once | INTRAVENOUS | Status: AC
  Administered 2023-09-11: 1000 mL via INTRAVENOUS

## 2023-09-11 MED ORDER — CLEVIDIPINE BUTYRATE 0.5 MG/ML IV EMUL
INTRAVENOUS | Status: AC
  Filled 2023-09-11: qty 50

## 2023-09-11 MED ORDER — CEFAZOLIN SODIUM-DEXTROSE 2-4 GM/100ML-% IV SOLN
2.0000 g | Freq: Three times a day (TID) | INTRAVENOUS | Status: AC
  Administered 2023-09-11 – 2023-09-13 (×6): 2 g via INTRAVENOUS
  Filled 2023-09-11 (×6): qty 100

## 2023-09-11 MED ORDER — ASPIRIN 81 MG PO CHEW: 324.0000 mg | CHEWABLE_TABLET | Freq: Every day | ORAL | Status: DC

## 2023-09-11 MED ORDER — TRANEXAMIC ACID (OHS) BOLUS VIA INFUSION
15.0000 mg/kg | INTRAVENOUS | Status: AC
  Administered 2023-09-11: 945 mg via INTRAVENOUS
  Filled 2023-09-11: qty 945

## 2023-09-11 MED ORDER — PROPOFOL 10 MG/ML IV BOLUS
INTRAVENOUS | Status: AC
  Filled 2023-09-11: qty 20

## 2023-09-11 MED ORDER — ACETAMINOPHEN 160 MG/5ML PO SOLN: 650.0000 mg | Freq: Once | ORAL | Status: AC

## 2023-09-11 MED ORDER — PLASMA-LYTE A IV SOLN
INTRAVENOUS | Status: DC | PRN
  Administered 2023-09-11: 1000 mL via INTRAVASCULAR

## 2023-09-11 MED ORDER — THROMBIN (RECOMBINANT) 20000 UNITS EX SOLR
CUTANEOUS | Status: AC
Start: 2023-09-11 — End: ?
  Filled 2023-09-11: qty 20000

## 2023-09-11 MED ORDER — ACETAMINOPHEN 160 MG/5ML PO SOLN
1000.0000 mg | Freq: Four times a day (QID) | ORAL | Status: AC
  Administered 2023-09-11 – 2023-09-15 (×4): 1000 mg
  Filled 2023-09-11 (×4): qty 40.6

## 2023-09-11 MED ORDER — SUCCINYLCHOLINE CHLORIDE 200 MG/10ML IV SOSY
PREFILLED_SYRINGE | INTRAVENOUS | Status: DC | PRN
  Administered 2023-09-11: 100 mg via INTRAVENOUS

## 2023-09-11 MED ORDER — ROCURONIUM BROMIDE 10 MG/ML (PF) SYRINGE
PREFILLED_SYRINGE | INTRAVENOUS | Status: DC | PRN
  Administered 2023-09-11 (×2): 50 mg via INTRAVENOUS
  Administered 2023-09-11: 100 mg via INTRAVENOUS

## 2023-09-11 MED ORDER — ESMOLOL HCL 100 MG/10ML IV SOLN
INTRAVENOUS | Status: DC | PRN
  Administered 2023-09-11: 40 mg via INTRAVENOUS

## 2023-09-11 MED ORDER — PANTOPRAZOLE SODIUM 40 MG PO TBEC
40.0000 mg | DELAYED_RELEASE_TABLET | Freq: Every day | ORAL | Status: DC
  Administered 2023-09-13 – 2023-09-18 (×6): 40 mg via ORAL
  Filled 2023-09-11 (×6): qty 1

## 2023-09-11 MED ORDER — METHYLPREDNISOLONE SODIUM SUCC 125 MG IJ SOLR
INTRAMUSCULAR | Status: DC | PRN
  Administered 2023-09-11: 125 mg via INTRAVENOUS

## 2023-09-11 MED ORDER — THROMBIN 20000 UNITS EX SOLR
OROMUCOSAL | Status: DC | PRN
  Administered 2023-09-11 (×3): 2 mL via TOPICAL

## 2023-09-11 MED ORDER — VANCOMYCIN HCL 1250 MG/250ML IV SOLN
1250.0000 mg | INTRAVENOUS | Status: AC
  Administered 2023-09-11: 1250 mg via INTRAVENOUS
  Filled 2023-09-11: qty 250

## 2023-09-11 MED ORDER — SODIUM CHLORIDE 0.9 % IV SOLN: INTRAVENOUS | Status: DC | PRN

## 2023-09-11 MED ORDER — TRANEXAMIC ACID 1000 MG/10ML IV SOLN
1.5000 mg/kg/h | INTRAVENOUS | Status: AC
  Administered 2023-09-11: 1.5 mg/kg/h via INTRAVENOUS
  Filled 2023-09-11: qty 25

## 2023-09-11 MED ORDER — SIMVASTATIN 20 MG PO TABS
40.0000 mg | ORAL_TABLET | Freq: Every day | ORAL | Status: DC
  Administered 2023-09-12 – 2023-09-17 (×6): 40 mg via ORAL
  Filled 2023-09-11 (×6): qty 2

## 2023-09-11 MED ORDER — EPINEPHRINE HCL 5 MG/250ML IV SOLN IN NS
0.0000 ug/min | INTRAVENOUS | Status: DC
  Filled 2023-09-11: qty 250

## 2023-09-11 MED ORDER — NITROGLYCERIN 2 % TD OINT
1.0000 [in_us] | TOPICAL_OINTMENT | Freq: Once | TRANSDERMAL | Status: AC
  Administered 2023-09-11: 1 [in_us] via TOPICAL
  Filled 2023-09-11: qty 1

## 2023-09-11 MED ORDER — SODIUM CHLORIDE 0.9% FLUSH
3.0000 mL | Freq: Two times a day (BID) | INTRAVENOUS | Status: DC
  Administered 2023-09-12 – 2023-09-17 (×12): 3 mL via INTRAVENOUS

## 2023-09-11 MED ORDER — BISACODYL 5 MG PO TBEC
10.0000 mg | DELAYED_RELEASE_TABLET | Freq: Every day | ORAL | Status: DC
  Administered 2023-09-12 – 2023-09-15 (×4): 10 mg via ORAL
  Filled 2023-09-11 (×4): qty 2

## 2023-09-11 MED ORDER — THROMBIN 20000 UNITS EX SOLR
CUTANEOUS | Status: DC | PRN
  Administered 2023-09-11: 20000 [IU] via TOPICAL

## 2023-09-11 MED ORDER — ACETAMINOPHEN 500 MG PO TABS
1000.0000 mg | ORAL_TABLET | Freq: Four times a day (QID) | ORAL | Status: AC
  Administered 2023-09-12 – 2023-09-16 (×14): 1000 mg via ORAL
  Filled 2023-09-11 (×15): qty 2

## 2023-09-11 MED ORDER — PROTAMINE SULFATE 10 MG/ML IV SOLN
INTRAVENOUS | Status: AC
  Filled 2023-09-11: qty 25

## 2023-09-11 MED ORDER — ARTIFICIAL TEARS OPHTHALMIC OINT
TOPICAL_OINTMENT | OPHTHALMIC | Status: DC | PRN
  Administered 2023-09-11: 1 via OPHTHALMIC

## 2023-09-11 MED ORDER — SUCCINYLCHOLINE CHLORIDE 200 MG/10ML IV SOSY
PREFILLED_SYRINGE | INTRAVENOUS | Status: AC
  Filled 2023-09-11: qty 10

## 2023-09-11 MED ORDER — LACTATED RINGERS IV SOLN: INTRAVENOUS | Status: DC | PRN

## 2023-09-11 MED ORDER — ALBUMIN HUMAN 5 % IV SOLN
250.0000 mL | INTRAVENOUS | Status: DC | PRN
  Administered 2023-09-12: 12.5 g via INTRAVENOUS

## 2023-09-11 MED ORDER — LEVOTHYROXINE SODIUM 50 MCG PO TABS
50.0000 ug | ORAL_TABLET | Freq: Every day | ORAL | Status: DC
  Filled 2023-09-11: qty 1

## 2023-09-11 MED ORDER — PLASMA-LYTE A IV SOLN
INTRAVENOUS | Status: DC
  Filled 2023-09-11: qty 5

## 2023-09-11 MED ORDER — PHENYLEPHRINE 80 MCG/ML (10ML) SYRINGE FOR IV PUSH (FOR BLOOD PRESSURE SUPPORT)
PREFILLED_SYRINGE | INTRAVENOUS | Status: DC | PRN
  Administered 2023-09-11: 40 ug via INTRAVENOUS
  Administered 2023-09-11: 160 ug via INTRAVENOUS

## 2023-09-11 MED ORDER — METOPROLOL TARTRATE 5 MG/5ML IV SOLN: 2.5000 mg | INTRAVENOUS | Status: DC | PRN

## 2023-09-11 MED ORDER — 0.9 % SODIUM CHLORIDE (POUR BTL) OPTIME
TOPICAL | Status: DC | PRN
  Administered 2023-09-11: 6000 mL

## 2023-09-11 MED ORDER — OXYCODONE HCL 5 MG PO TABS
5.0000 mg | ORAL_TABLET | ORAL | Status: DC | PRN
Start: 2023-09-11 — End: 2023-09-18
  Administered 2023-09-12: 5 mg via ORAL
  Filled 2023-09-11: qty 1

## 2023-09-11 MED ORDER — STERILE WATER FOR IRRIGATION IR SOLN
Status: DC | PRN
  Administered 2023-09-11: 2000 mL

## 2023-09-11 MED ORDER — SODIUM CHLORIDE 0.9% FLUSH
3.0000 mL | INTRAVENOUS | Status: DC | PRN
Start: 2023-09-12 — End: 2023-09-18

## 2023-09-11 MED ORDER — CHLORHEXIDINE GLUCONATE 0.12 % MT SOLN
15.0000 mL | OROMUCOSAL | Status: AC
  Administered 2023-09-11: 15 mL via OROMUCOSAL
  Filled 2023-09-11: qty 15

## 2023-09-11 MED ORDER — ORAL CARE MOUTH RINSE
15.0000 mL | OROMUCOSAL | Status: DC
  Administered 2023-09-12 (×8): 15 mL via OROMUCOSAL

## 2023-09-11 MED ORDER — SODIUM CHLORIDE 0.9 % IV SOLN: INTRAVENOUS | Status: DC

## 2023-09-11 MED ORDER — ESMOLOL HCL-SODIUM CHLORIDE 2000 MG/100ML IV SOLN
25.0000 ug/kg/min | INTRAVENOUS | Status: DC
  Administered 2023-09-11: 25 ug/kg/min via INTRAVENOUS
  Filled 2023-09-11: qty 100

## 2023-09-11 MED ORDER — METOPROLOL TARTRATE 12.5 MG HALF TABLET
12.5000 mg | ORAL_TABLET | Freq: Two times a day (BID) | ORAL | Status: DC
Start: 2023-09-12 — End: 2023-09-12

## 2023-09-11 MED ORDER — CHLORHEXIDINE GLUCONATE CLOTH 2 % EX PADS
6.0000 | MEDICATED_PAD | Freq: Every day | CUTANEOUS | Status: DC
  Administered 2023-09-12 – 2023-09-15 (×4): 6 via TOPICAL

## 2023-09-11 MED ORDER — PROTAMINE SULFATE 10 MG/ML IV SOLN
INTRAVENOUS | Status: DC | PRN
  Administered 2023-09-11: 230 mg via INTRAVENOUS

## 2023-09-11 MED ORDER — ROCURONIUM BROMIDE 10 MG/ML (PF) SYRINGE
PREFILLED_SYRINGE | INTRAVENOUS | Status: AC
  Filled 2023-09-11: qty 20

## 2023-09-11 MED ORDER — METOCLOPRAMIDE HCL 5 MG/ML IJ SOLN
10.0000 mg | Freq: Four times a day (QID) | INTRAMUSCULAR | Status: AC
  Administered 2023-09-11 – 2023-09-13 (×6): 10 mg via INTRAVENOUS
  Filled 2023-09-11 (×6): qty 2

## 2023-09-11 MED ORDER — METOPROLOL TARTRATE 25 MG/10 ML ORAL SUSPENSION: 12.5000 mg | Freq: Two times a day (BID) | ORAL | Status: DC

## 2023-09-11 MED ORDER — ARTIFICIAL TEARS OPHTHALMIC OINT
TOPICAL_OINTMENT | OPHTHALMIC | Status: AC
  Filled 2023-09-11: qty 3.5

## 2023-09-11 MED ORDER — DEXTROSE 50 % IV SOLN: 0.0000 mL | INTRAVENOUS | Status: DC | PRN

## 2023-09-11 MED ORDER — ASPIRIN 81 MG PO CHEW: 324.0000 mg | CHEWABLE_TABLET | Freq: Once | ORAL | Status: DC

## 2023-09-11 MED ORDER — MIDAZOLAM HCL (PF) 10 MG/2ML IJ SOLN
INTRAMUSCULAR | Status: AC
Start: 2023-09-11 — End: ?
  Filled 2023-09-11: qty 2

## 2023-09-11 MED ORDER — PROPOFOL 10 MG/ML IV BOLUS
INTRAVENOUS | Status: DC | PRN
  Administered 2023-09-11: 60 mg via INTRAVENOUS
  Administered 2023-09-11: 100 mg via INTRAVENOUS

## 2023-09-11 MED ORDER — HEMOSTATIC AGENTS (NO CHARGE) OPTIME
TOPICAL | Status: DC | PRN
  Administered 2023-09-11: 1 via TOPICAL

## 2023-09-11 MED ORDER — ASPIRIN 81 MG PO CHEW
324.0000 mg | CHEWABLE_TABLET | Freq: Once | ORAL | Status: AC
  Administered 2023-09-11: 324 mg via ORAL
  Filled 2023-09-11: qty 4

## 2023-09-11 MED ORDER — NITROGLYCERIN IN D5W 200-5 MCG/ML-% IV SOLN
0.0000 ug/min | INTRAVENOUS | Status: DC
  Administered 2023-09-12: 5 ug/min via INTRAVENOUS
  Administered 2023-09-13: 35 ug/min via INTRAVENOUS
  Filled 2023-09-11 (×2): qty 250

## 2023-09-11 MED ORDER — LIDOCAINE 2% (20 MG/ML) 5 ML SYRINGE
INTRAMUSCULAR | Status: AC
  Filled 2023-09-11: qty 5

## 2023-09-11 MED ORDER — SODIUM CHLORIDE 0.9% IV SOLUTION: Freq: Once | INTRAVENOUS | Status: DC

## 2023-09-11 MED ORDER — POTASSIUM CHLORIDE 2 MEQ/ML IV SOLN
80.0000 meq | INTRAVENOUS | Status: DC
  Filled 2023-09-11: qty 40

## 2023-09-11 MED ORDER — NITROGLYCERIN IN D5W 200-5 MCG/ML-% IV SOLN
2.0000 ug/min | INTRAVENOUS | Status: DC
  Filled 2023-09-11: qty 250

## 2023-09-11 MED ORDER — METOCLOPRAMIDE HCL 5 MG/ML IJ SOLN
5.0000 mg | Freq: Once | INTRAMUSCULAR | Status: AC
  Administered 2023-09-11: 5 mg via INTRAVENOUS
  Filled 2023-09-11: qty 2

## 2023-09-11 MED ORDER — INSULIN REGULAR(HUMAN) IN NACL 100-0.9 UT/100ML-% IV SOLN
INTRAVENOUS | Status: AC
  Administered 2023-09-11: .7 [IU]/h via INTRAVENOUS
  Filled 2023-09-11: qty 100

## 2023-09-11 MED ORDER — DOCUSATE SODIUM 100 MG PO CAPS: 200.0000 mg | ORAL_CAPSULE | Freq: Every day | ORAL | Status: DC

## 2023-09-11 MED ORDER — LIDOCAINE 2% (20 MG/ML) 5 ML SYRINGE
INTRAMUSCULAR | Status: DC | PRN
  Administered 2023-09-11: 100 mg via INTRAVENOUS

## 2023-09-11 MED ORDER — ORAL CARE MOUTH RINSE: 15.0000 mL | OROMUCOSAL | Status: DC | PRN

## 2023-09-11 MED ORDER — HEPARIN SODIUM (PORCINE) 1000 UNIT/ML IJ SOLN
INTRAMUSCULAR | Status: AC
  Filled 2023-09-11: qty 1

## 2023-09-11 MED ORDER — MORPHINE SULFATE (PF) 2 MG/ML IV SOLN
1.0000 mg | INTRAVENOUS | Status: DC | PRN
  Administered 2023-09-12: 2 mg via INTRAVENOUS
  Administered 2023-09-12: 3 mg via INTRAVENOUS
  Filled 2023-09-11: qty 1
  Filled 2023-09-11: qty 2

## 2023-09-11 MED ORDER — HEPARIN SODIUM (PORCINE) 1000 UNIT/ML IJ SOLN
INTRAMUSCULAR | Status: DC | PRN
  Administered 2023-09-11: 23000 [IU] via INTRAVENOUS

## 2023-09-11 MED ORDER — HEPARIN 30,000 UNITS/1000 ML (OHS) CELLSAVER SOLUTION
Status: DC
  Filled 2023-09-11: qty 1000

## 2023-09-11 MED ORDER — MILRINONE LACTATE IN DEXTROSE 20-5 MG/100ML-% IV SOLN
0.3000 ug/kg/min | INTRAVENOUS | Status: DC
  Filled 2023-09-11: qty 100

## 2023-09-11 MED ORDER — MIDAZOLAM HCL (PF) 5 MG/ML IJ SOLN
INTRAMUSCULAR | Status: DC | PRN
  Administered 2023-09-11 (×2): 2 mg via INTRAVENOUS

## 2023-09-11 MED ORDER — MIDAZOLAM HCL 2 MG/2ML IJ SOLN
2.0000 mg | INTRAMUSCULAR | Status: DC | PRN
Start: 2023-09-11 — End: 2023-09-12
  Filled 2023-09-11: qty 2

## 2023-09-11 MED ORDER — OXYCODONE-ACETAMINOPHEN 5-325 MG PO TABS
1.0000 | ORAL_TABLET | Freq: Once | ORAL | Status: AC
  Administered 2023-09-11: 1 via ORAL
  Filled 2023-09-11: qty 1

## 2023-09-11 MED ORDER — SODIUM CHLORIDE 0.9 % IV SOLN: 250.0000 mL | INTRAVENOUS | Status: AC

## 2023-09-11 MED ORDER — ONDANSETRON HCL 4 MG/2ML IJ SOLN
4.0000 mg | Freq: Four times a day (QID) | INTRAMUSCULAR | Status: DC | PRN
  Administered 2023-09-13: 4 mg via INTRAVENOUS
  Filled 2023-09-11: qty 2

## 2023-09-11 MED ORDER — SODIUM CHLORIDE 0.45 % IV SOLN
INTRAVENOUS | Status: DC | PRN
  Administered 2023-09-11: 10 mL/h via INTRAVENOUS

## 2023-09-11 MED ORDER — NICARDIPINE HCL IN NACL 20-0.86 MG/200ML-% IV SOLN
3.0000 mg/h | INTRAVENOUS | Status: DC
  Administered 2023-09-11: 5 mg/h via INTRAVENOUS
  Filled 2023-09-11: qty 200

## 2023-09-11 MED ORDER — VANCOMYCIN HCL IN DEXTROSE 1-5 GM/200ML-% IV SOLN
1000.0000 mg | Freq: Once | INTRAVENOUS | Status: AC
  Administered 2023-09-12: 1000 mg via INTRAVENOUS
  Filled 2023-09-11: qty 200

## 2023-09-11 MED ORDER — INSULIN REGULAR(HUMAN) IN NACL 100-0.9 UT/100ML-% IV SOLN: INTRAVENOUS | Status: DC

## 2023-09-11 MED ORDER — NOREPINEPHRINE 4 MG/250ML-% IV SOLN
0.0000 ug/min | INTRAVENOUS | Status: DC
  Filled 2023-09-11: qty 250

## 2023-09-11 MED ORDER — PANTOPRAZOLE SODIUM 40 MG IV SOLR
40.0000 mg | Freq: Every day | INTRAVENOUS | Status: AC
  Administered 2023-09-11 – 2023-09-12 (×2): 40 mg via INTRAVENOUS
  Filled 2023-09-11 (×2): qty 10

## 2023-09-11 MED ORDER — DEXMEDETOMIDINE HCL IN NACL 400 MCG/100ML IV SOLN
0.0000 ug/kg/h | INTRAVENOUS | Status: DC
  Administered 2023-09-12: 0.4 ug/kg/h via INTRAVENOUS
  Filled 2023-09-11: qty 100

## 2023-09-11 MED ORDER — PHENYLEPHRINE HCL-NACL 20-0.9 MG/250ML-% IV SOLN
30.0000 ug/min | INTRAVENOUS | Status: AC
  Administered 2023-09-11: 10 ug/min via INTRAVENOUS
  Filled 2023-09-11: qty 250

## 2023-09-11 MED ORDER — PHENYLEPHRINE HCL-NACL 20-0.9 MG/250ML-% IV SOLN: 0.0000 ug/min | INTRAVENOUS | Status: DC

## 2023-09-11 MED ORDER — POTASSIUM CHLORIDE 10 MEQ/50ML IV SOLN
10.0000 meq | INTRAVENOUS | Status: AC
  Administered 2023-09-11 – 2023-09-12 (×3): 10 meq via INTRAVENOUS

## 2023-09-11 SURGICAL SUPPLY — 85 items
ADAPTER CARDIO PERF ANTE/RETRO (ADAPTER) ×2 IMPLANT
APPLICATOR TIP STD SYR BGAT-SY (MISCELLANEOUS) IMPLANT
BAG DECANTER FOR FLEXI CONT (MISCELLANEOUS) ×2 IMPLANT
BLADE CLIPPER SURG (BLADE) ×2 IMPLANT
BLADE STERNUM SYSTEM 6 (BLADE) ×2 IMPLANT
BLADE SURG 15 STRL LF DISP TIS (BLADE) ×2 IMPLANT
CANISTER SUCT 3000ML PPV (MISCELLANEOUS) ×2 IMPLANT
CANNULA AORTIC ROOT 9FR (CANNULA) IMPLANT
CANNULA GUNDRY RCSP 15FR (MISCELLANEOUS) ×2 IMPLANT
CATH HEART VENT LEFT (CATHETERS) ×2 IMPLANT
CATH ROBINSON RED A/P 18FR (CATHETERS) ×6 IMPLANT
CATH THORACIC 36FR (CATHETERS) ×2 IMPLANT
CATH THORACIC 36FR RT ANG (CATHETERS) ×2 IMPLANT
DRAPE WARM FLUID 44X44 (DRAPES) IMPLANT
DRSG COVADERM 4X14 (GAUZE/BANDAGES/DRESSINGS) ×2 IMPLANT
ELECT CAUTERY BLADE 6.4 (BLADE) ×2 IMPLANT
ELECT REM PT RETURN 9FT ADLT (ELECTROSURGICAL) ×4
ELECTRODE REM PT RTRN 9FT ADLT (ELECTROSURGICAL) ×4 IMPLANT
FELT TEFLON 6X6 (MISCELLANEOUS) IMPLANT
GAUZE 4X4 16PLY ~~LOC~~+RFID DBL (SPONGE) ×2 IMPLANT
GAUZE SPONGE 4X4 12PLY STRL (GAUZE/BANDAGES/DRESSINGS) ×2 IMPLANT
GLOVE BIO SURGEON STRL SZ 6 (GLOVE) IMPLANT
GLOVE BIO SURGEON STRL SZ 6.5 (GLOVE) IMPLANT
GLOVE BIO SURGEON STRL SZ7 (GLOVE) IMPLANT
GLOVE BIO SURGEON STRL SZ7.5 (GLOVE) IMPLANT
GLOVE SURG MICRO LTX SZ7 (GLOVE) ×4 IMPLANT
GOWN STRL REUS W/ TWL LRG LVL3 (GOWN DISPOSABLE) ×8 IMPLANT
GOWN STRL REUS W/ TWL XL LVL3 (GOWN DISPOSABLE) ×2 IMPLANT
GRAFT CV 30X8WVN NDL (Graft) IMPLANT
GRAFT WOVEN D/V 30DX30L (Vascular Products) IMPLANT
HEMOSTAT POWDER SURGIFOAM 1G (HEMOSTASIS) ×4 IMPLANT
HEMOSTAT SURGICEL 2X14 (HEMOSTASIS) ×2 IMPLANT
INSERT FOGARTY 61MM (MISCELLANEOUS) IMPLANT
INSERT FOGARTY SM (MISCELLANEOUS) ×2 IMPLANT
INSERT FOGARTY XLG (MISCELLANEOUS) IMPLANT
KIT BASIN OR (CUSTOM PROCEDURE TRAY) ×2 IMPLANT
KIT CATH CPB BARTLE (MISCELLANEOUS) IMPLANT
KIT SUCTION CATH 14FR (SUCTIONS) ×2 IMPLANT
KIT TURNOVER KIT B (KITS) ×2 IMPLANT
LINE VENT (MISCELLANEOUS) IMPLANT
LOOP VASCLR EXTRA MAXI WHITE (MISCELLANEOUS) IMPLANT
LOOPS VASCLR EXTRA MAXI WHITE (MISCELLANEOUS) ×2 IMPLANT
NDL AORTIC AIR ASPIRATING (NEEDLE) IMPLANT
NEEDLE AORTIC AIR ASPIRATING (NEEDLE) IMPLANT
NS IRRIG 1000ML POUR BTL (IV SOLUTION) ×10 IMPLANT
PACK E OPEN HEART (SUTURE) ×2 IMPLANT
PACK OPEN HEART (CUSTOM PROCEDURE TRAY) ×2 IMPLANT
PAD ARMBOARD 7.5X6 YLW CONV (MISCELLANEOUS) ×4 IMPLANT
POSITIONER HEAD DONUT 9IN (MISCELLANEOUS) ×2 IMPLANT
SEALANT SURG COSEAL 4ML (VASCULAR PRODUCTS) IMPLANT
SEALANT SURG COSEAL 8ML (VASCULAR PRODUCTS) IMPLANT
SET MPS 3-ND DEL (MISCELLANEOUS) IMPLANT
SET VEIN GRAFT PERF (SET/KITS/TRAYS/PACK) IMPLANT
SIZER VASCULAR GRAFT LG 24-38 (SIZER) IMPLANT
SPONGE T-LAP 18X18 ~~LOC~~+RFID (SPONGE) ×8 IMPLANT
SPONGE T-LAP 4X18 ~~LOC~~+RFID (SPONGE) ×4 IMPLANT
STOPCOCK 4 WAY LG BORE MALE ST (IV SETS) IMPLANT
SUT ETHIBOND 2 0 SH 36X2 (SUTURE) IMPLANT
SUT PROLENE 3 0 RB 1 (SUTURE) IMPLANT
SUT PROLENE 3 0 SH 48 (SUTURE) IMPLANT
SUT PROLENE 3 0 SH DA (SUTURE) ×2 IMPLANT
SUT PROLENE 3 0 SH1 36 (SUTURE) IMPLANT
SUT PROLENE 4 0 SH DA (SUTURE) IMPLANT
SUT PROLENE 4-0 RB1 .5 CRCL 36 (SUTURE) ×6 IMPLANT
SUT PROLENE 5 0 C 1 36 (SUTURE) IMPLANT
SUT PROLENE 6 0 C 1 30 (SUTURE) IMPLANT
SUT SILK 1 TIES 10X30 (SUTURE) IMPLANT
SUT SILK 2 0 SH CR/8 (SUTURE) IMPLANT
SUT STEEL 6MS V (SUTURE) IMPLANT
SUT VIC AB 1 CTX 27 (SUTURE) IMPLANT
SUT VIC AB 1 CTX36XBRD ANBCTR (SUTURE) ×4 IMPLANT
SUT VIC AB 2-0 CT1 TAPERPNT 27 (SUTURE) IMPLANT
SUT VIC AB 3-0 SH 27X BRD (SUTURE) IMPLANT
SUT VIC AB 3-0 X1 27 (SUTURE) IMPLANT
SYR 10ML KIT SKIN ADHESIVE (MISCELLANEOUS) IMPLANT
SYR BULB IRRIG 60ML STRL (SYRINGE) IMPLANT
SYSTEM SAHARA CHEST DRAIN ATS (WOUND CARE) ×2 IMPLANT
TAPE PAPER 2X10 WHT MICROPORE (GAUZE/BANDAGES/DRESSINGS) IMPLANT
TAPE PAPER 3X10 WHT MICROPORE (GAUZE/BANDAGES/DRESSINGS) IMPLANT
TOWEL GREEN STERILE (TOWEL DISPOSABLE) ×2 IMPLANT
TOWEL GREEN STERILE FF (TOWEL DISPOSABLE) ×2 IMPLANT
TUBE SUCT INTRACARD DLP 20F (MISCELLANEOUS) IMPLANT
VASCULAR TIE EXTRA MAXI WHITE (MISCELLANEOUS) ×2
VENT LEFT HEART 12002 (CATHETERS) ×2
WATER STERILE IRR 1000ML POUR (IV SOLUTION) ×4 IMPLANT

## 2023-09-11 NOTE — ED Provider Notes (Signed)
EMERGENCY DEPARTMENT AT [facility] Provider Note   CSN: [id number] Arrival date & time: 09/11/23  1115     History  Chief Complaint  Patient presents with   Chest Pain   Emesis    Darlene Burns is a 72 y.o. female.   Chest Pain Associated symptoms: nausea, shortness of breath and vomiting   Emesis Associated symptoms: arthralgias   Patient presenting for chest pain.  Onset was this morning while she was doing chores around the house.  Chest pain is midsternal and radiating to left shoulder and jaw.  She also has pain in her back which she says is chronic for her.  She had associated diaphoresis, emesis, and shortness of breath.  She is given Zofran prior to arrival.  The EMS reported soft blood pressures.  Pain has been ongoing.  Per chart review, her medical history includes fibromyalgia, HLD, arthritis.  She is not followed by cardiology.  She is prescribed daily aspirin.     Home Medications Prior to Admission medications   Medication Sig Start Date End Date Taking? Authorizing Provider  Amantadine HCl 100 MG tablet Take 100 mg by mouth 2 (two) times daily.  07/18/14   [provider]  Ascorbic Acid (VITAMIN C PO) Take by mouth.    [provider]  aspirin 81 MG tablet Take 81 mg by mouth daily.    [provider]  calcium citrate-vitamin D (CITRACAL+D) 315-200 MG-UNIT per tablet Take 1 tablet by mouth 2 (two) times daily.    [provider]  Cholecalciferol (VITAMIN D-1000 MAX ST) 25 MCG (1000 UT) tablet Take by mouth.    [provider]  co-enzyme Q-10 30 MG capsule Take 30 mg by mouth 3 (three) times daily.    [provider]  levothyroxine (SYNTHROID, LEVOTHROID) 50 MCG tablet Take 50 mcg by mouth daily before breakfast.     [provider]  MAGNESIUM PO Take by mouth.    [provider]  Multiple Vitamins tablet Take 1 tablet by mouth daily.     [provider]   simvastatin (ZOCOR) 40 MG tablet Take 40 mg by mouth daily at 6 PM.  07/03/12   [provider]  traZODone (DESYREL) 100 MG tablet Take 50 mg by mouth at bedtime.  05/29/12   [provider]  TURMERIC PO Take by mouth.    [provider]      Allergies    Amoxicillin, Oxcarbazepine, Prednisone, Sulfa antibiotics, and Sulfamethoxazole    Review of Systems   Review of Systems  Respiratory:  Positive for shortness of breath.   Cardiovascular:  Positive for chest pain.  Gastrointestinal:  Positive for nausea and vomiting.  Musculoskeletal:  Positive for arthralgias.  All other systems reviewed and are negative.   Physical Exam Updated Vital Signs BP 111/72   Pulse 70   Temp 99 F (37.2 C) (Oral)   Resp 17   Ht 5\' 3"  (1.6 m)   Wt 63 kg   SpO2 100%   BMI 24.60 kg/m  Physical Exam Vitals and nursing note reviewed.  Constitutional:      General: She is not in acute distress.    Appearance: She is well-developed and normal weight. She is ill-appearing. She is not toxic-appearing.  HENT:     Head: Normocephalic and atraumatic.  Eyes:     Conjunctiva/sclera: Conjunctivae normal.  Cardiovascular:     Rate and Rhythm: Regular rhythm. Tachycardia present.  Heart sounds: No murmur heard. Pulmonary:     Effort: Pulmonary effort is normal. No tachypnea or respiratory distress.     Breath sounds: Normal breath sounds. No decreased breath sounds, wheezing, rhonchi or rales.  Chest:     Chest wall: No tenderness.  Abdominal:     Palpations: Abdomen is soft.     Tenderness: There is no abdominal tenderness.  Musculoskeletal:        General: No swelling. Normal range of motion.     Cervical back: Normal range of motion and neck supple.     Right lower leg: No edema.     Left lower leg: No edema.  Skin:    General: Skin is warm and dry.     Coloration: Skin is not cyanotic or pale.  Neurological:     General: No focal deficit present.     Mental  Status: She is alert and oriented to person, place, and time.  Psychiatric:        Mood and Affect: Mood normal.        Behavior: Behavior normal.     ED Results / Procedures / Treatments   Labs (all labs ordered are listed, but only abnormal results are displayed) Labs Reviewed  BASIC METABOLIC PANEL - Abnormal; Notable for the following components:      Result Value   Glucose, Bld 114 (*)    Creatinine, Ser 1.36 (*)    Calcium 8.8 (*)    GFR, Estimated 41 (*)    All other components within normal limits  HEPATIC FUNCTION PANEL - Abnormal; Notable for the following components:   Total Protein 5.9 (*)    All other components within normal limits  CBC WITH DIFFERENTIAL/PLATELET - Abnormal; Notable for the following components:   WBC 11.2 (*)    Neutro Abs 8.1 (*)    All other components within normal limits  CBG MONITORING, ED - Abnormal; Notable for the following components:   Glucose-Capillary 100 (*)    All other components within normal limits  I-STAT CHEM 8, ED - Abnormal; Notable for the following components:   Creatinine, Ser 1.10 (*)    Glucose, Bld 108 (*)    Calcium, Ion 1.11 (*)    All other components within normal limits  MAGNESIUM  LIPASE, BLOOD  TYPE AND SCREEN  PREPARE RBC (CROSSMATCH)  ABO/RH  TROPONIN I (HIGH SENSITIVITY)  TROPONIN I (HIGH SENSITIVITY)    EKG EKG Interpretation Date/Time:  Monday September 11 2023 11:42:16 EST Ventricular Rate:  58 PR Interval:    QRS Duration:  66 QT Interval:  530 QTC Calculation: 520 R Axis:   61  Text Interpretation: Junctional rhythm Septal infarct , age undetermined Abnormal ECG Confirmed by Gloris Manchester [phone]) on 09/11/2023 12:32:28 PM  Radiology CT Angio Chest/Abd/Pel for Dissection W and/or Wo Contrast Result Date: 09/11/2023 CLINICAL DATA:  Midsternal chest pain radiating to left jaw and shoulder, vomiting acute aortic syndrome suspected EXAM: CT ANGIOGRAPHY CHEST, ABDOMEN AND PELVIS TECHNIQUE:  Non-contrast CT of the chest was initially obtained. Multidetector CT imaging through the chest, abdomen and pelvis was performed using the standard protocol during bolus administration of intravenous contrast. Multiplanar reconstructed images and MIPs were obtained and reviewed to evaluate the vascular anatomy. RADIATION DOSE REDUCTION: This exam was performed according to the departmental dose-optimization program which includes automated exposure control, adjustment of the mA and/or kV according to patient size and/or use of iterative reconstruction technique. CONTRAST:  75mL OMNIPAQUE IOHEXOL 350 MG/ML  SOLN COMPARISON:  None Available. FINDINGS: CTA CHEST FINDINGS VASCULAR Aorta: Satisfactory opacification of the aorta. Focal dissection or penetrating ulceration arising from the anterior aspect of the distal ascending thoracic aorta, with crescentic intramural hematoma extending proximally to the aortic root (series 7, image 55) and distally along the length of the aortic arch and descending thoracic aorta (series 7, image 42). Maximum caliber of the ascending thoracic aorta 4.5 x 4.4 cm (series 7, image 41). Two-vessel bovine type branching pattern of the aortic arch, with intramural hematoma involving the origin of the conjoined innominate left carotid artery origin (series 7, image 24). Maximum caliber of the descending thoracic aorta 2.5 x 2.5 cm (series 7, image 52). Mild mixed atherosclerosis. Cardiovascular: No evidence of pulmonary embolism on limited non-tailored examination. Normal heart size. Left coronary artery calcifications. No pericardial effusion. Review of the MIP images confirms the above findings. NON VASCULAR Mediastinum/Nodes: No enlarged mediastinal, hilar, or axillary lymph nodes. Thyroid gland, trachea, and esophagus demonstrate no significant findings. Lungs/Pleura: Mild bibasilar scarring or atelectasis with elevation of the left hemidiaphragm. No pleural effusion or pneumothorax.  Musculoskeletal: No chest wall abnormality. No acute osseous findings. Review of the MIP images confirms the above findings. CTA ABDOMEN AND PELVIS FINDINGS VASCULAR Descending thoracic aortic intramural hematoma extends to the origin of the celiac axis, which is normal in caliber. There appear to be elongated strands of thrombus within the celiac axis, common hepatic artery, and splenic artery, which does not appear to represent a dissection flap extending into the vessel (series 7, image 93, 84). Normal contour and caliber of the abdominal aorta proper, with otherwise patent branch vessels. Standard branching pattern of the abdominal aorta with solitary bilateral renal arteries. Moderate mixed calcific atherosclerosis Review of the MIP images confirms the above findings. NON-VASCULAR Hepatobiliary: No solid liver abnormality is seen. No gallstones, gallbladder wall thickening, or biliary dilatation. Pancreas: Unremarkable. No pancreatic ductal dilatation or surrounding inflammatory changes. Spleen: Normal in size without significant abnormality. Adrenals/Urinary Tract: Adrenal glands are unremarkable. Kidneys are normal, without renal calculi, solid lesion, or hydronephrosis. Bladder is unremarkable. Stomach/Bowel: Stomach is within normal limits. Appendix appears normal. No evidence of bowel wall thickening, distention, or inflammatory changes. Descending and sigmoid diverticulosis. Lymphatic: No enlarged abdominal or pelvic lymph nodes. Reproductive: No mass or other significant abnormality. Other: No abdominal wall hernia or abnormality. No ascites. Musculoskeletal: No acute osseous findings. IMPRESSION: 1. Focal dissection or penetrating ulceration arising from the anterior aspect of the distal ascending thoracic aorta, with crescentic intramural hematoma extending proximally to the aortic root and distally along the length of the aortic arch and descending thoracic aorta to the level of the celiac axis  origin. Maximum caliber of the ascending thoracic aorta 4.5 x 4.4 cm. 2. There appear to be elongated strands of thrombus within the celiac axis, common hepatic artery, and splenic artery, which do not appear to represent a dissection flap extending into the vessel. Aortic branch vessels are otherwise patent and the abdominal aorta is normal in caliber. 3. Coronary artery disease. 4. Descending and sigmoid diverticulosis without evidence of acute diverticulitis. Critical findings discussed by telephone with Dr. Durwin Nora, 3:30 p.m., 09/11/2023. Electronically Signed   By: Jearld Lesch M.D.   On: 09/11/2023 15:35   DG Chest Portable 1 View Result Date: 09/11/2023 CLINICAL DATA:  Chest pain. EXAM: PORTABLE CHEST 1 VIEW COMPARISON:  Chest radiograph dated December 05, 2020. FINDINGS: Patient is rotated to the right. Elevation of the left hemidiaphragm. The heart size  and mediastinal contours are within normal limits. Similar small dense nodule in the left midlung is compatible with small calcified granuloma. No focal consolidation, sizeable pleural effusion, or pneumothorax. No acute osseous abnormality. IMPRESSION: Elevation of the left hemidiaphragm. Otherwise, no acute cardiopulmonary findings. Electronically Signed   By: Hart Robinsons M.D.   On: 09/11/2023 12:47    Procedures Procedures    Medications Ordered in ED Medications  nicardipine (CARDENE) 20mg  in 0.86% saline IV infusion (0.1 mg/ml) (0 mg/hr Intravenous Stopped 09/11/23 1648)  esmolol (BREVIBLOC) 2000 mg / 100 mL (20 mg/mL) infusion (0 mcg/kg/min  63 kg Intravenous Stopped 09/11/23 1648)  dexmedetomidine (PRECEDEX) 400 MCG/100ML (4 mcg/mL) infusion (has no administration in time range)  insulin regular, human (MYXREDLIN) 100 units/ 100 mL infusion (has no administration in time range)  EPINEPHrine (ADRENALIN) 5 mg in NS 250 mL (0.02 mg/mL) premix infusion (has no administration in time range)  milrinone (PRIMACOR) 20 MG/100 ML (0.2  mg/mL) infusion (has no administration in time range)  nitroGLYCERIN 50 mg in dextrose 5 % 250 mL (0.2 mg/mL) infusion (has no administration in time range)  norepinephrine (LEVOPHED) 4mg  in (0.016 mg/mL) premix infusion (has no administration in time range)  phenylephrine (NEO-SYNEPHRINE) 20mg /NS premix infusion (has no administration in time range)  magnesium sulfate (IV Push/IM) injection 40 mEq (has no administration in time range)  potassium chloride injection 80 mEq (has no administration in time range)  heparin 30,000 units/NS 1000 mL solution for CELLSAVER (has no administration in time range)  heparin sodium (porcine) 5,000 Units, papaverine 60 mg in electrolyte-A (PLASMALYTE-A PH 7.4) 1,000 mL irrigation (has no administration in time range)  tranexamic acid (CYKLOKAPRON) pump prime solution 126 mg (has no administration in time range)  tranexamic acid (CYKLOKAPRON) bolus via infusion - over 30 minutes 945 mg (has no administration in time range)  tranexamic acid (CYKLOKAPRON) 2,500 mg in sodium chloride 0.9 % 250 mL (10 mg/mL) infusion (has no administration in time range)  vancomycin (VANCOREADY) IVPB 1250 mg/250 mL (has no administration in time range)  ceFAZolin (ANCEF) IVPB 2g/100 mL premix (has no administration in time range)  [facility] Blood Cardioplegia vial (lidocaine/magnesium/mannitol 0.26g-4g-6.4g) (has no administration in time range)  0.9 %  sodium chloride infusion (Manually program via Guardrails IV Fluids) ( Intravenous MAR Hold 09/11/23 1632)  aspirin chewable tablet 324 mg (324 mg Oral Given 09/11/23 1152)  nitroGLYCERIN (NITROGLYN) 2 % ointment 1 inch (1 inch Topical Given 09/11/23 1152)  oxyCODONE-acetaminophen (PERCOCET/ROXICET) 5-325 MG per tablet 1 tablet (1 tablet Oral Given 09/11/23 1152)  lactated ringers bolus 1,000 mL (0 mLs Intravenous Stopped 09/11/23 1508)  metoCLOPramide (REGLAN) injection 5 mg (5 mg Intravenous Given 09/11/23 1504)   iohexol (OMNIPAQUE) 350 MG/ML injection 75 mL (75 mLs Intravenous Contrast Given 09/11/23 1501)  HYDROmorphone (DILAUDID) injection 0.5 mg (0.5 mg Intravenous Given 09/11/23 1538)  ceFAZolin (ANCEF) IVPB 2g/100 mL premix (2 g Intravenous Given 09/11/23 1705)    ED Course/ Medical Decision Making/ A&P                                 Medical Decision Making Amount and/or Complexity of Data Reviewed Labs: ordered. Radiology: ordered.  Risk OTC drugs. Prescription drug management. Decision regarding hospitalization.   This patient presents to the ED for concern of chest pain, this involves an extensive number of treatment options, and is a complaint that carries with it a  high risk of complications and morbidity.  The differential diagnosis includes ACS, dissection, PE, pericarditis, pneumonia, GERD, PUD   Co morbidities that complicate the patient evaluation  Fibromyalgia, HLD, CKD   Additional history obtained:  Additional history obtained from N/A External records from outside source obtained and reviewed including EMR   Lab Tests:  I Ordered, and personally interpreted labs.  The pertinent results include: Creatinine is higher than expected.  Chronicity of this is unknown but she does have documented history of CKD.  A mild leukocytosis is present.  Hemoglobin is normal.  Troponin is normal.   Imaging Studies ordered:  I ordered imaging studies including chest x-ray, CTA dissection study I independently visualized and interpreted imaging which showed focal dissection and/penetrating ulcer of ascending thoracic aorta with intramural hematoma extending to level of celiac axis origin I agree with the radiologist interpretation   Cardiac Monitoring: / EKG:  The patient was maintained on a cardiac monitor.  I personally viewed and interpreted the cardiac monitored which showed an underlying rhythm of: Sinus rhythm   Consultations Obtained:  I requested consultation  with the cardiothoracic surgeon, Dr. Laneta Simmers,  and discussed lab and imaging findings as well as pertinent plan - they recommend: Operative management  Problem List / ED Course / Critical interventions / Medication management  Patient presenting for acute onset of chest pain with radiation to left shoulder and jaw.  She had associated diaphoresis and emesis.  She has pain in her back as well which she says is chronic for her.  Presentation is concerning for CAD.  EKG does not show STEMI.  Patient was given ASA.  Percocet and NTG were ordered.  Workup was initiated.  On reassessment, patient reports improved chest pain.  Blood pressure remained soft.  NTG was removed and IV fluids were ordered.  She does endorse some ongoing nausea.  Reglan was ordered.  Lab work was notable for mild leukocytosis.  Creatinine was elevated when compared to lab work from 9 years ago.  Dissection study showed type a dissection.  CT surgery was consulted.  Currently, blood pressure and heart rate are at goal.  Blood pressure was checked in other arm.  SBP was in the range of 120.  Currently pain is 4/10 in severity.  Will treat pain and initiate Cardene and esmolol for goal SBP of 100 and heart rate less than 60.  Dr. Laneta Simmers arrived in the ED to evaluate patient.  She was taken the OR. I ordered medication including Percocet and Dilaudid for analgesia; Reglan for nausea; IV fluid for hydration; Cardene and esmolol for blood pressure and heart rate control Reevaluation of the patient after these medicines showed that the patient improved I have reviewed the patients home medicines and have made adjustments as needed   Social Determinants of Health:  Lives independently  CRITICAL CARE Performed by: Gloris Manchester   Total critical care time: 34 minutes  Critical care time was exclusive of separately billable procedures and treating other patients.  Critical care was necessary to treat or prevent imminent or life-threatening  deterioration.  Critical care was time spent personally by me on the following activities: development of treatment plan with patient and/or surrogate as well as nursing, discussions with consultants, evaluation of patient's response to treatment, examination of patient, obtaining history from patient or surrogate, ordering and performing treatments and interventions, ordering and review of laboratory studies, ordering and review of radiographic studies, pulse oximetry and re-evaluation of patient's condition.  Final Clinical Impression(s) / ED Diagnoses Final diagnoses:  Type 1 dissection of ascending aorta [facility])    Rx / DC Orders ED Discharge Orders     None         Gloris Manchester, MD 09/11/23 1724

## 2023-09-11 NOTE — Transfer of Care (Signed)
Immediate Anesthesia Transfer of Care Note  Patient: Darlene Burns  Procedure(s) Performed: REPAIR OF ACUTE ASCENDING THORACIC AORTIC DISSECTION USING 30 MM HEMASHIELD PLATINUM INTRAOPERATIVE TRANSESOPHAGEAL ECHOCARDIOGRAM (Esophagus)  Patient Location: SICU  Anesthesia Type:General  Level of Consciousness: Patient remains intubated per anesthesia plan  Airway & Oxygen Therapy: Patient remains intubated per anesthesia plan and Patient placed on Ventilator (see vital sign flow sheet for setting)  Post-op Assessment: Report given to RN and Post -op Vital signs reviewed and stable  Post vital signs: Reviewed and stable  Last Vitals:  Vitals Value Taken Time  BP 100/69 09/11/23 2233  Temp    Pulse 80 09/11/23 2237  Resp 16 09/11/23 2237  SpO2 99 % 09/11/23 2237  Vitals shown include unfiled device data.  Last Pain:  Vitals:   09/11/23 1602  TempSrc:   PainSc: 0-No pain         Complications: No notable events documented.

## 2023-09-11 NOTE — Anesthesia Procedure Notes (Addendum)
Central Venous Catheter Insertion Performed by: [place], MD, anesthesiologist Start/End12/23/2024 4:45 PM, 09/11/2023 5:05 PM Patient location: Pre-op. Preanesthetic checklist: patient identified, IV checked, site marked, risks and benefits discussed, surgical consent, monitors and equipment checked, pre-op evaluation, timeout performed and anesthesia consent Lidocaine 1% used for infiltration and patient sedated Hand hygiene performed  and maximum sterile barriers used  Catheter size: 8 Fr Total catheter length 16. Central line was placed.MAC introducer Swan type:thermodilution Procedure performed using ultrasound guided technique. Ultrasound Notes:anatomy identified, needle tip was noted to be adjacent to the nerve/plexus identified, no ultrasound evidence of intravascular and/or intraneural injection and image(s) printed for medical record Attempts: 1 Following insertion, dressing applied and line sutured. Post procedure assessment: blood return through all ports, no air and free fluid flow  Patient tolerated the procedure well with no immediate complications. Additional procedure comments: PA catheter attempted to float, however resistance met at approximately 30cm. Catheter was not forced. Balloon was put down and catheter pulled back and parked at 20. Marland Kitchen

## 2023-09-11 NOTE — ED Triage Notes (Signed)
Pt BIB EMS from home with complaint of midsternal chest pain radiates to left jaw and left shoulder. Pt vomited twice prior arrival. Given 4mg  zofran by EMS. Pt is diaphoretic.   EMS reports pt to be hypotensive enroute 98/68

## 2023-09-11 NOTE — Brief Op Note (Signed)
09/11/2023  8:26 PM  PATIENT:  Darlene Burns  72 y.o. 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   Delay start of Pharmacological VTE agent (>24hrs) due to surgical blood loss or risk of bleeding: yes

## 2023-09-11 NOTE — Consult Note (Signed)
[address]       [place] [address]             [phone]      Cardiothoracic Surgery Consultation  Reason for Consult: Acute type A aortic dissection Referring Physician: Gloris Manchester, MD  ER physician  Darlene Burns is an 72 y.o. female.  HPI:   Pt is a 72 year old woman with fibromyalgia and chronic degenerative spine disease who lives alone and is still relatively active taking care of her house and yard who developed acute onset of midsternal CP radiating to left jaw and shoulder about 10 am today. She also had pain between her shoulder blades but has chronic back pain so back pain not that unusual. She also developed SOB, nausea and vomited and diaphoresis so called EMS. BP was soft per EMS in the 90's. CTA chest showed acute type A dissection with a tear starting in mid ascending aorta with clotted false lumen extending proximally to the aortic root and distally to the celiac artery. Her pain is better after IV dilaudid. No prior cardiac history.  Past Medical History:  Diagnosis Date   Allergy    Arthritis    hands feet neck    Dystonia    Fibromyalgia    Focal dystonia    History of shingles    Hypercholesteremia    Kidney disease    Other seizures (HCC)    seizures until age 36, none since   Thyroid disorder     Past Surgical History:  Procedure Laterality Date   COLONOSCOPY     LAPAROSCOPY  1992   TONSILLECTOMY  1956    Family History  Problem Relation Age of Onset   Lung cancer Father    Arthritis Mother    Colon cancer Neg Hx    Colon polyps Neg Hx    Rectal cancer Neg Hx    Stomach cancer Neg Hx    Esophageal cancer Neg Hx     Social History:  reports that she has never smoked. She has never used smokeless tobacco. She reports that she does not drink alcohol and does not use drugs.  Allergies:  Allergies  Allergen Reactions   Amoxicillin Diarrhea    Profuse diarrhea   Oxcarbazepine    Prednisone    Sulfa Antibiotics  Rash   Sulfamethoxazole Rash    Medications: I have reviewed the patient's current medications. Prior to Admission: (Not in a hospital admission)  Scheduled:  sodium chloride   Intravenous Once   epinephrine  0-10 mcg/min Intravenous To OR   heparin sodium (porcine) 5,000 Units, papaverine 60 mg in electrolyte-A (PLASMALYTE-A PH 7.4) 1,000 mL irrigation   Irrigation To OR   insulin   Intravenous To OR   [facility] Blood Cardioplegia vial (lidocaine/magnesium/mannitol 0.26g-4g-6.4g)   Intracoronary To OR   magnesium sulfate  40 mEq Other To OR   phenylephrine  30-200 mcg/min Intravenous To OR   potassium chloride  80 mEq Other To OR   tranexamic acid  15 mg/kg Intravenous To OR   tranexamic acid  2 mg/kg Intracatheter To OR   Continuous:   ceFAZolin (ANCEF) IV      ceFAZolin (ANCEF) IV     dexmedetomidine     esmolol 75 mcg/kg/min (09/11/23 1616)   heparin 30,000 units/NS 1000 mL solution for CELLSAVER     milrinone     niCARDipine     nitroGLYCERIN     norepinephrine  tranexamic acid (CYKLOKAPRON) 2,500 mg in sodium chloride 0.9 % 250 mL (10 mg/mL) infusion     vancomycin     PRN: Anti-infectives (From admission, onward)    Start     Dose/Rate Route Frequency Ordered Stop   09/11/23 1615  vancomycin (VANCOREADY) IVPB 1250 mg/250 mL        1,250 mg 166.7 mL/hr over 90 Minutes Intravenous To Surgery 09/11/23 1608 09/12/23 1615   09/11/23 1615  ceFAZolin (ANCEF) IVPB 2g/100 mL premix        2 g 200 mL/hr over 30 Minutes Intravenous To Surgery 09/11/23 1608 09/12/23 1615   09/11/23 1615  ceFAZolin (ANCEF) IVPB 2g/100 mL premix        2 g 200 mL/hr over 30 Minutes Intravenous To Surgery 09/11/23 1608 09/12/23 1615       Results for orders placed or performed during the hospital encounter of 09/11/23 (from the past 48 hours)  Basic metabolic panel     Status: Abnormal   Collection Time: 09/11/23 11:47 AM  Result Value Ref Range   Sodium 141 135 - 145 mmol/L    Potassium 4.4 3.5 - 5.1 mmol/L   Chloride 110 98 - 111 mmol/L   CO2 25 22 - 32 mmol/L   Glucose, Bld 114 (H) 70 - 99 mg/dL    Comment: Glucose reference range applies only to samples taken after fasting for at least 8 hours.   BUN 16 8 - 23 mg/dL   Creatinine, Ser 2.13 (H) 0.44 - 1.00 mg/dL   Calcium 8.8 (L) 8.9 - 10.3 mg/dL   GFR, Estimated 41 (L) >60 mL/min    Comment: (NOTE) Calculated using the CKD-EPI Creatinine Equation (2021)    Anion gap 6 5 - 15    Comment: Performed at [facility], [address] [place], [place] [address]  Troponin I (High Sensitivity)     Status: None   Collection Time: 09/11/23 11:47 AM  Result Value Ref Range   Troponin I (High Sensitivity) 4 <18 ng/L    Comment: (NOTE) Elevated high sensitivity troponin I (hsTnI) values and significant  changes across serial measurements may suggest ACS but many other  chronic and acute conditions are known to elevate hsTnI results.  Refer to the "Links" section for chest pain algorithms and additional  guidance. Performed at [facility], [address], [place], [place] [address]   Magnesium     Status: None   Collection Time: 09/11/23 11:47 AM  Result Value Ref Range   Magnesium 2.0 1.7 - 2.4 mg/dL    Comment: Performed at [facility], [address], [place], [place] [address]  Lipase, blood     Status: None   Collection Time: 09/11/23 11:47 AM  Result Value Ref Range   Lipase 33 11 - 51 U/L    Comment: Performed at [facility], [address], [place], [place] [address]  Hepatic function panel     Status: Abnormal   Collection Time: 09/11/23 11:47 AM  Result Value Ref Range   Total Protein 5.9 (L) 6.5 - 8.1 g/dL   Albumin 3.6 3.5 - 5.0 g/dL   AST 24 15 - 41 U/L   ALT 21 0 - 44 U/L   Alkaline Phosphatase 61 38 - 126 U/L   Total Bilirubin 0.8 <1.2 mg/dL   Bilirubin, Direct 0.1 0.0 - 0.2 mg/dL   Indirect Bilirubin 0.7 0.3 - 0.9 mg/dL    Comment: Performed  at [facility], [address], [place], [place] [address]  CBC with Differential/Platelet     Status: Abnormal   Collection Time: 09/11/23 11:47 AM  Result Value Ref Range   WBC 11.2 (H) 4.0 - 10.5 K/uL   RBC 4.38 3.87 - 5.11 MIL/uL   Hemoglobin 13.5 12.0 - 15.0 g/dL   HCT 09.8 11.9 - 14.7 %   MCV 91.6 80.0 - 100.0 fL   MCH 30.8 26.0 - 34.0 pg   MCHC 33.7 30.0 - 36.0 g/dL   RDW 82.9 56.2 - 13.0 %   Platelets 155 150 - 400 K/uL   nRBC 0.0 0.0 - 0.2 %   Neutrophils Relative % 73 %   Neutro Abs 8.1 (H) 1.7 - 7.7 K/uL   Lymphocytes Relative 20 %   Lymphs Abs 2.2 0.7 - 4.0 K/uL   Monocytes Relative 6 %   Monocytes Absolute 0.7 0.1 - 1.0 K/uL   Eosinophils Relative 1 %   Eosinophils Absolute 0.1 0.0 - 0.5 K/uL   Basophils Relative 0 %   Basophils Absolute 0.1 0.0 - 0.1 K/uL   Immature Granulocytes 0 %   Abs Immature Granulocytes 0.04 0.00 - 0.07 K/uL    Comment: Performed at [facility], [address] [place], [place] [address]  I-stat chem 8, ED     Status: Abnormal   Collection Time: 09/11/23 12:00 PM  Result Value Ref Range   Sodium 144 135 - 145 mmol/L   Potassium 4.4 3.5 - 5.1 mmol/L   Chloride 108 98 - 111 mmol/L   BUN 18 8 - 23 mg/dL   Creatinine, Ser 4.69 (H) 0.44 - 1.00 mg/dL   Glucose, Bld 629 (H) 70 - 99 mg/dL    Comment: Glucose reference range applies only to samples taken after fasting for at least 8 hours.   Calcium, Ion 1.11 (L) 1.15 - 1.40 mmol/L   TCO2 24 22 - 32 mmol/L   Hemoglobin 12.9 12.0 - 15.0 g/dL   HCT 52.8 41.3 - 24.4 %  CBG monitoring, ED     Status: Abnormal   Collection Time: 09/11/23 12:40 PM  Result Value Ref Range   Glucose-Capillary 100 (H) 70 - 99 mg/dL    Comment: Glucose reference range applies only to samples taken after fasting for at least 8 hours.    CT Angio Chest/Abd/Pel for Dissection W and/or Wo Contrast Result Date: 09/11/2023 CLINICAL DATA:  Midsternal chest pain radiating to left jaw and shoulder, vomiting acute aortic  syndrome suspected EXAM: CT ANGIOGRAPHY CHEST, ABDOMEN AND PELVIS TECHNIQUE: Non-contrast CT of the chest was initially obtained. Multidetector CT imaging through the chest, abdomen and pelvis was performed using the standard protocol during bolus administration of intravenous contrast. Multiplanar reconstructed images and MIPs were obtained and reviewed to evaluate the vascular anatomy. RADIATION DOSE REDUCTION: This exam was performed according to the departmental dose-optimization program which includes automated exposure control, adjustment of the mA and/or kV according to patient size and/or use of iterative reconstruction technique. CONTRAST:  75mL OMNIPAQUE IOHEXOL 350 MG/ML SOLN COMPARISON:  None Available. FINDINGS: CTA CHEST FINDINGS VASCULAR Aorta: Satisfactory opacification of the aorta. Focal dissection or penetrating ulceration arising from the anterior aspect of the distal ascending thoracic aorta, with crescentic intramural hematoma extending proximally to the aortic root (series 7, image 55) and distally along the length of the aortic arch and descending thoracic aorta (series 7, image 42). Maximum caliber of the ascending thoracic aorta 4.5  x 4.4 cm (series 7, image 41). Two-vessel bovine type branching pattern of the aortic arch, with intramural hematoma involving the origin of the conjoined innominate left carotid artery origin (series 7, image 24). Maximum caliber of the descending thoracic aorta 2.5 x 2.5 cm (series 7, image 52). Mild mixed atherosclerosis. Cardiovascular: No evidence of pulmonary embolism on limited non-tailored examination. Normal heart size. Left coronary artery calcifications. No pericardial effusion. Review of the MIP images confirms the above findings. NON VASCULAR Mediastinum/Nodes: No enlarged mediastinal, hilar, or axillary lymph nodes. Thyroid gland, trachea, and esophagus demonstrate no significant findings. Lungs/Pleura: Mild bibasilar scarring or atelectasis with  elevation of the left hemidiaphragm. No pleural effusion or pneumothorax. Musculoskeletal: No chest wall abnormality. No acute osseous findings. Review of the MIP images confirms the above findings. CTA ABDOMEN AND PELVIS FINDINGS VASCULAR Descending thoracic aortic intramural hematoma extends to the origin of the celiac axis, which is normal in caliber. There appear to be elongated strands of thrombus within the celiac axis, common hepatic artery, and splenic artery, which does not appear to represent a dissection flap extending into the vessel (series 7, image 93, 84). Normal contour and caliber of the abdominal aorta proper, with otherwise patent branch vessels. Standard branching pattern of the abdominal aorta with solitary bilateral renal arteries. Moderate mixed calcific atherosclerosis Review of the MIP images confirms the above findings. NON-VASCULAR Hepatobiliary: No solid liver abnormality is seen. No gallstones, gallbladder wall thickening, or biliary dilatation. Pancreas: Unremarkable. No pancreatic ductal dilatation or surrounding inflammatory changes. Spleen: Normal in size without significant abnormality. Adrenals/Urinary Tract: Adrenal glands are unremarkable. Kidneys are normal, without renal calculi, solid lesion, or hydronephrosis. Bladder is unremarkable. Stomach/Bowel: Stomach is within normal limits. Appendix appears normal. No evidence of bowel wall thickening, distention, or inflammatory changes. Descending and sigmoid diverticulosis. Lymphatic: No enlarged abdominal or pelvic lymph nodes. Reproductive: No mass or other significant abnormality. Other: No abdominal wall hernia or abnormality. No ascites. Musculoskeletal: No acute osseous findings. IMPRESSION: 1. Focal dissection or penetrating ulceration arising from the anterior aspect of the distal ascending thoracic aorta, with crescentic intramural hematoma extending proximally to the aortic root and distally along the length of the aortic  arch and descending thoracic aorta to the level of the celiac axis origin. Maximum caliber of the ascending thoracic aorta 4.5 x 4.4 cm. 2. There appear to be elongated strands of thrombus within the celiac axis, common hepatic artery, and splenic artery, which do not appear to represent a dissection flap extending into the vessel. Aortic branch vessels are otherwise patent and the abdominal aorta is normal in caliber. 3. Coronary artery disease. 4. Descending and sigmoid diverticulosis without evidence of acute diverticulitis. Critical findings discussed by telephone with Dr. Durwin Nora, 3:30 p.m., 09/11/2023. Electronically Signed   By: Jearld Lesch M.D.   On: 09/11/2023 15:35   DG Chest Portable 1 View Result Date: 09/11/2023 CLINICAL DATA:  Chest pain. EXAM: PORTABLE CHEST 1 VIEW COMPARISON:  Chest radiograph dated December 05, 2020. FINDINGS: Patient is rotated to the right. Elevation of the left hemidiaphragm. The heart size and mediastinal contours are within normal limits. Similar small dense nodule in the left midlung is compatible with small calcified granuloma. No focal consolidation, sizeable pleural effusion, or pneumothorax. No acute osseous abnormality. IMPRESSION: Elevation of the left hemidiaphragm. Otherwise, no acute cardiopulmonary findings. Electronically Signed   By: Hart Robinsons M.D.   On: 09/11/2023 12:47    Review of Systems  Constitutional:  Positive for diaphoresis.  HENT: Negative.    Eyes: Negative.   Respiratory:  Positive for shortness of breath.   Cardiovascular:  Positive for chest pain. Negative for leg swelling.  Gastrointestinal:  Positive for nausea and vomiting.  Endocrine: Negative.   Genitourinary: Negative.   Musculoskeletal:  Positive for arthralgias, back pain and neck pain.  Skin: Negative.   Allergic/Immunologic: Negative.   Neurological:  Negative for dizziness and syncope.  Hematological: Negative.   Psychiatric/Behavioral: Negative.     Blood  pressure 114/65, pulse 84, temperature 99 F (37.2 C), temperature source Oral, resp. rate 16, height 5\' 3"  (1.6 m), weight 63 kg, SpO2 96%. Physical Exam Constitutional:      Appearance: She is well-developed.  HENT:     Head: Normocephalic and atraumatic.  Eyes:     Extraocular Movements: Extraocular movements intact.     Conjunctiva/sclera: Conjunctivae normal.     Pupils: Pupils are equal, round, and reactive to light.  Cardiovascular:     Rate and Rhythm: Normal rate and regular rhythm.     Pulses: Normal pulses.     Heart sounds: Normal heart sounds. No murmur heard. Pulmonary:     Effort: Pulmonary effort is normal.     Breath sounds: Normal breath sounds.  Abdominal:     General: Abdomen is flat. Bowel sounds are normal. There is no distension.     Palpations: Abdomen is soft.     Tenderness: There is no abdominal tenderness.  Musculoskeletal:        General: No swelling.     Cervical back: Normal range of motion and neck supple.  Skin:    General: Skin is warm and dry.  Neurological:     General: No focal deficit present.     Mental Status: She is alert and oriented to person, place, and time.  Psychiatric:        Mood and Affect: Mood normal.        Behavior: Behavior normal.    Narrative & Impression  CLINICAL DATA:  Midsternal chest pain radiating to left jaw and shoulder, vomiting acute aortic syndrome suspected   EXAM: CT ANGIOGRAPHY CHEST, ABDOMEN AND PELVIS   TECHNIQUE: Non-contrast CT of the chest was initially obtained.   Multidetector CT imaging through the chest, abdomen and pelvis was performed using the standard protocol during bolus administration of intravenous contrast. Multiplanar reconstructed images and MIPs were obtained and reviewed to evaluate the vascular anatomy.   RADIATION DOSE REDUCTION: This exam was performed according to the departmental dose-optimization program which includes automated exposure control, adjustment of the mA  and/or kV according to patient size and/or use of iterative reconstruction technique.   CONTRAST:  75mL OMNIPAQUE IOHEXOL 350 MG/ML SOLN   COMPARISON:  None Available.   FINDINGS: CTA CHEST FINDINGS   VASCULAR   Aorta: Satisfactory opacification of the aorta. Focal dissection or penetrating ulceration arising from the anterior aspect of the distal ascending thoracic aorta, with crescentic intramural hematoma extending proximally to the aortic root (series 7, image 55) and distally along the length of the aortic arch and descending thoracic aorta (series 7, image 42). Maximum caliber of the ascending thoracic aorta 4.5 x 4.4 cm (series 7, image 41). Two-vessel bovine type branching pattern of the aortic arch, with intramural hematoma involving the origin of the conjoined innominate left carotid artery origin (series 7, image 24). Maximum caliber of the descending thoracic aorta 2.5 x 2.5 cm (series 7, image 52). Mild mixed atherosclerosis.   Cardiovascular: No evidence  of pulmonary embolism on limited non-tailored examination. Normal heart size. Left coronary artery calcifications. No pericardial effusion.   Review of the MIP images confirms the above findings.   NON VASCULAR   Mediastinum/Nodes: No enlarged mediastinal, hilar, or axillary lymph nodes. Thyroid gland, trachea, and esophagus demonstrate no significant findings.   Lungs/Pleura: Mild bibasilar scarring or atelectasis with elevation of the left hemidiaphragm. No pleural effusion or pneumothorax.   Musculoskeletal: No chest wall abnormality. No acute osseous findings.   Review of the MIP images confirms the above findings.   CTA ABDOMEN AND PELVIS FINDINGS   VASCULAR   Descending thoracic aortic intramural hematoma extends to the origin of the celiac axis, which is normal in caliber. There appear to be elongated strands of thrombus within the celiac axis, common hepatic artery, and splenic artery, which  does not appear to represent a dissection flap extending into the vessel (series 7, image 93, 84). Normal contour and caliber of the abdominal aorta proper, with otherwise patent branch vessels. Standard branching pattern of the abdominal aorta with solitary bilateral renal arteries. Moderate mixed calcific atherosclerosis   Review of the MIP images confirms the above findings.   NON-VASCULAR   Hepatobiliary: No solid liver abnormality is seen. No gallstones, gallbladder wall thickening, or biliary dilatation.   Pancreas: Unremarkable. No pancreatic ductal dilatation or surrounding inflammatory changes.   Spleen: Normal in size without significant abnormality.   Adrenals/Urinary Tract: Adrenal glands are unremarkable. Kidneys are normal, without renal calculi, solid lesion, or hydronephrosis. Bladder is unremarkable.   Stomach/Bowel: Stomach is within normal limits. Appendix appears normal. No evidence of bowel wall thickening, distention, or inflammatory changes. Descending and sigmoid diverticulosis.   Lymphatic: No enlarged abdominal or pelvic lymph nodes.   Reproductive: No mass or other significant abnormality.   Other: No abdominal wall hernia or abnormality. No ascites.   Musculoskeletal: No acute osseous findings.   IMPRESSION: 1. Focal dissection or penetrating ulceration arising from the anterior aspect of the distal ascending thoracic aorta, with crescentic intramural hematoma extending proximally to the aortic root and distally along the length of the aortic arch and descending thoracic aorta to the level of the celiac axis origin. Maximum caliber of the ascending thoracic aorta 4.5 x 4.4 cm. 2. There appear to be elongated strands of thrombus within the celiac axis, common hepatic artery, and splenic artery, which do not appear to represent a dissection flap extending into the vessel. Aortic branch vessels are otherwise patent and the abdominal aorta is  normal in caliber. 3. Coronary artery disease. 4. Descending and sigmoid diverticulosis without evidence of acute diverticulitis.   Critical findings discussed by telephone with Dr. Durwin Nora, 3:30 p.m., 09/11/2023.     Electronically Signed   By: Jearld Lesch M.D.   On: 09/11/2023 15:35      Assessment/Plan:  Acute type A aortic dissection. This will require surgical treatment. I discussed the operative procedure with the patient and her close friend including alternatives, benefits and risks; including but not limited to bleeding, blood transfusion, infection, stroke, myocardial infarction, graft failure, heart block requiring a permanent pacemaker, organ dysfunction, and death.  Darlene Burns understands and agrees to proceed.  We will schedule surgery for this afternoon as soon as OR is ready.  Darlene Burns 09/11/2023, 4:13 PM

## 2023-09-11 NOTE — Progress Notes (Signed)
  Echocardiogram Echocardiogram Transesophageal has been performed.  Darlene Burns 09/11/2023, 5:05 PM

## 2023-09-11 NOTE — ED Notes (Signed)
 CCMD notified. Pt on monitor.

## 2023-09-11 NOTE — Hospital Course (Addendum)
HPI:  Darlene Burns is a 72 year old woman with fibromyalgia and chronic degenerative spine disease who lives alone and is still relatively active taking care of her house and yard who developed acute onset of midsternal CP radiating to left jaw and shoulder about 10 am today. She also had pain between her shoulder blades but has chronic back pain so back pain not that unusual. She also developed SOB, nausea and vomited and diaphoresis so called EMS. BP was soft per EMS in the 90's. CTA chest showed acute type A dissection with a tear starting in mid ascending aorta with clotted false lumen extending proximally to the aortic root and distally to the celiac artery. Her pain is better after IV dilaudid. No prior cardiac history.  Dr. Laneta Simmers reviewed the patient's diagnostic studies and determined she would benefit from surgical intervention. He reviewed the treatment options as well as the risks and benefits of surgery with the patient. [name] was agreeable to proceed with surgery.  Hospital Course:  [name] was emergently brought to the [facility] operating room on 12/23. She underwent repair of acute ascending thoracic aortic dissection utilizing Median Sternotomy, Extracorporeal circulation, Right Axillary Cannulation, and Supra Coronary Replacement of the Ascending Aorta hemi arch with a 30 mm Hemashield graft.. She tolerated the procedure well and was transferred to the SICU in stable condition.  The patient required Neo-synephrine post operatively.  She was weaned off as hemodynamics allowed.  She was started on Lasix to facilitate diuresis.  She was weaned and extubated on 12/24.  She developed leukocytosis which was not unexpected due to intra operative administration of steroids.  She developed Hypertension and NTG drip was initiated.  She was also started on oral regimen of Lopressor.  Her pacing wires and chest tubes were removed without difficulty.  She developed post operative thrombocytopenia  and due to his Lovenox was not started.  The patient has been weaned off NTG drip.  She developed hypokalemia due to diuretics and was supplemented accordingly.  She required transfusion for post operative blood loss anemia, with improvement of Hgb to 9.5.  She was also started on iron supplements.  She was evaluated by PT who recommended rehab.  She was maintaining NSR and stable for transfer to the progressive care unit on 09/15/2023.

## 2023-09-11 NOTE — Op Note (Signed)
CARDIOVASCULAR SURGERY OPERATIVE NOTE  09/11/2023  Surgeon:  Alleen Borne, MD  First Assistant: Eduard Roux,  PA-C: An experienced assistant was required given the complexity of this surgery and the standard of surgical care. The assistant was needed for exposure, dissection, suctioning, retraction of delicate tissues and sutures, instrument exchange and for overall help during this procedure.    Preoperative Diagnosis:  Acute type A aortic dissection   Postoperative Diagnosis:  Same   Procedure: Emergent  Median Sternotomy Extracorporeal circulation Right axillary artery cannulation with 8 mm Hemashield graft 4.   Supra-coronary replacement of the ascending aorta (hemi-arch) using a 30 mm Hemashield graft under deep hypothermic circulatory arrest   Anesthesia:  General Endotracheal   Clinical History/Surgical Indication:  The patient is a 72 year old woman with fibromyalgia and chronic degenerative spine disease but no hx of heart disease, aneurysm or HTN who presented with new onset chest pain radiating to her jaw and shoulder as well as upper back associated with N/V/diaphoresis. CTA of the chest showed an acute type A aortic dissection with a tear or penetrating ulcer in the mid ascending aorta adjacent to the PA. The dissection extended proximally into the aortic root and distally to the celiac artery. It was felt that emergent surgical repair was indicated. I discussed the operative procedure with the patient and her close friend including alternatives, benefits and risks; including but not limited to bleeding, blood transfusion, infection, stroke, myocardial infarction, graft failure, heart block requiring a permanent pacemaker, organ dysfunction, and death.  Dorisann Frames understands and agrees to proceed.   Preparation:  The patient was seen in the preoperative holding area and the correct patient, correct operation were confirmed with the patient after reviewing the  medical record and catheterization. The consent was signed by me. Preoperative antibiotics were given. A pulmonary arterial line and radial arterial line were placed by the anesthesia team. The patient was taken back to the operating room and positioned supine on the operating room table. After being placed under general endotracheal anesthesia by the anesthesia team a foley catheter was placed. The neck, chest, abdomen, and both legs were prepped with betadine soap and solution and draped in the usual sterile manner. A surgical time-out was taken and the correct patient and operative procedure were confirmed with the nursing and anesthesia staff.  TEE:  Performed by Dr. Charlynn Grimes. This showed normal LV systolic function. Type A aortic dissection with mild central AI.   Cardiopulmonary Bypass:  A median sternotomy was performed. The pericardium was opened in the midline. Right ventricular function appeared normal. The ascending aorta was mildly enlarged with diffuse ecchymosis of the wall.    Right axillary artery exposure and cannulation:  A transverse incision was made below the right clavicle. The pectoralis major muscle was split along its fibers and the pectoralis minor muscle was retracted laterally. The brachial plexus was identified and gently retracted laterally to expose the axillary artery. The artery was controlled proximally and distally with vessel loops. The patient was fully heparinized and ACT maintained greater than 400. The axillary artery was clamped proximally and distally with peripheral Debakey clamps. It was opened longitudinally. There was no sign of dissection here. An 8 mm Hemashield dacron graft was anastomosed in an end to side manner using continuous 5-0 prolene suture. CoSeal was applied for hemostasis and the clamp removed. The graft was connected to the arterial end of the bypass circuit.  Venous cannulation was performed via the right atrial  appendage using a two-staged  venous cannula. A temperature probe was inserted into the interventricular septum and an insulating pad was placed in the pericardium. CO2 was insufflated into the pericardium throughout the case to minimize intracardiac air.    Resection and grafting of ascending aortic aneurysm:  The patient was placed on cardiopulmonary bypass and a left ventricular vent was placed via the right superior pulmonary vein. Systemic cooling was begun. A retrograde cardioplegia cannula was placed through the right atrium into the coronary sinus without difficulty. A retrograde cerebral perfusion cannula was placed into the SVC through a pursestring suture and the SVC was encircled with a silastic tape.  After 20 minutes of cooling the target temperature of 19 degrees centigrade by venous return temp was reached. The patient was given Propofol and 125 mg of Solumedrol. The head was packed in ice. The bed was placed in steep trendelenburg. Circulatory arrest was begun and the blood volume emptied into the venous reservoir.  Cold KBC retrograde cardioplegia was given. After the loading dose of cardioplegia was completed continuous retrograde cerebral perfusion was begun and the SVC occluded with the silastic tape. The aorta was transected just proximal to the innominate artery beveling the resection out along the undersurface of the aortic arch (Hemiarch replacement). The aortic diameter was measured at 30 mm here. The false lumen was clotted. There was no tear in the aortic arch. A band of felt was cut to the appropriate size and shape and placed between the dissection layers. No bioglue was used distally. A 30 mm Hemasheild Platinum vascular graft was prepared. ( Catalog # [id number] P0, Lot [id number], SN [id number]). It was anastomosed to the aortic arch in an end to end manner using 3-0 prolene continuous suture with an external felt strip to reinforce the anastomisis. A light coating of CoSeal was applied to seal needle holes.  Circulation was slowly resumed. The tape was removed from the SVC. The aortic graft was cross-clamped proximal to the anastomosis and full CPB support was resumed. Circulatory arrest time was 26 minutes. Retrograde cerebral perfusion time was 19 minutes.  The proximal aorta was examined and there was a tear medially in the mid ascending aorta next to the PA. The dissection extended proximally into the non-coronary and right coronary sinuses. The false lumen was clotted. The clot was removed and the aorta was transected just above the sinotubular junction. The native valve was trileaflet with normal leaflets and no prolapse. A piece of felt was cut to the appropriate size and shape and inserted between the dissection layers in the right and non-coronary sinuses. Bioglue was use to seal the layers together over the felt.   The graft was then cut to the appropriate length and anastomosed end to end to the repaired supra-coronary aorta using continuous 3-0 prolene suture. CoSeal was applied to seal the needle holes in the grafts. A vent cannula was placed into the graft to remove any air. Deairing maneuvers were performed and the bed placed in trendelenburg position. A dose of warm reanimation cardioplegia was given.    Completion:   The patient was rewarmed to 37 degrees Centigrade. The crossclamp was removed with a time of 53 minutes. There was spontaneous return of sinus rhythm. The position of the graft was satisfactory. The vascular anastomoses all appeared grossly hemostatic. Two temporary epicardial pacing wires were placed on the right atrium and two on the right ventricle. The patient was weaned from CPB without difficulty on no inotropes. CPB time was  128 minutes. TEE showed a normal functioning aortic valve with trivial central AI.  LV function appeared normal. Heparin was fully reversed with protamine and the venous cannula removed. The axillary artery graft was ligated with a heavy silk tie and  divided. The stump was suture ligated with a 3-0 Prolene pledgetted horizontal mattress suture.  Plts and fibrinogen were very low so we gave her FFP, plts and cryo for coagulopathy. Hemostasis was achieved. Mediastinal drainage tubes were placed. The sternum was closed with #6 stainless steel wires. The fascia was closed with continuous # 1 vicryl suture. The subcutaneous tissue was closed with 2-0 vicryl continuous suture. The skin was closed with 3-0 vicryl subcuticular suture. The right axillary incision was closed in a similar manner. All sponge, needle, and instrument counts were reported correct at the end of the case. Dry sterile dressings were placed over the incisions and around the chest tubes which were connected to pleurevac suction. The patient was then transported to the surgical intensive care unit in stable condition.

## 2023-09-11 NOTE — Anesthesia Preprocedure Evaluation (Signed)
Anesthesia Evaluation  Patient identified by MRN, date of birth, ID band Patient awake    Reviewed: Allergy & Precautions, H&P , NPO status , Patient's Chart, lab work & pertinent test results, Unable to perform ROS - Chart review onlyPreop documentation limited or incomplete due to emergent nature of procedure.  Airway Mallampati: II  TM Distance: >3 FB Neck ROM: Full    Dental no notable dental hx.    Pulmonary neg pulmonary ROS   Pulmonary exam normal breath sounds clear to auscultation       Cardiovascular Normal cardiovascular exam Rhythm:Regular Rate:Normal  Acute Type A Dissection   Neuro/Psych Seizures -,  Sz as a child.   negative psych ROS   GI/Hepatic negative GI ROS, Neg liver ROS,,,  Endo/Other  negative endocrine ROS    Renal/GU Renal disease  negative genitourinary   Musculoskeletal  (+) Arthritis ,  Fibromyalgia -  Abdominal   Peds negative pediatric ROS (+)  Hematology negative hematology ROS (+)   Anesthesia Other Findings   Reproductive/Obstetrics negative OB ROS                             Anesthesia Physical Anesthesia Plan  ASA: 5 and emergent  Anesthesia Plan: General   Post-op Pain Management:    Induction: Intravenous  PONV Risk Score and Plan: Treatment may vary due to age or medical condition  Airway Management Planned: Oral ETT  Additional Equipment: Arterial line, CVP, PA Cath, TEE, 3D TEE and Ultrasound Guidance Line Placement  Intra-op Plan:   Post-operative Plan: Post-operative intubation/ventilation  Informed Consent: I have reviewed the patients History and Physical, chart, labs and discussed the procedure including the risks, benefits and alternatives for the proposed anesthesia with the patient or authorized representative who has indicated his/her understanding and acceptance.     Dental advisory given  Plan Discussed with:  CRNA  Anesthesia Plan Comments: (Brief emergent hx obtained from patient while rolling to OR.   Acute type A dissection. )       Anesthesia Quick Evaluation

## 2023-09-11 NOTE — Anesthesia Postprocedure Evaluation (Signed)
Anesthesia Post Note  Patient: Darlene Burns  Procedure(s) Performed: REPAIR OF ACUTE ASCENDING THORACIC AORTIC DISSECTION USING 30 MM HEMASHIELD PLATINUM INTRAOPERATIVE TRANSESOPHAGEAL ECHOCARDIOGRAM (Esophagus)     Patient location during evaluation: SICU Anesthesia Type: General Level of consciousness: sedated Pain management: pain level controlled Vital Signs Assessment: post-procedure vital signs reviewed and stable Respiratory status: patient remains intubated per anesthesia plan Cardiovascular status: stable Postop Assessment: no apparent nausea or vomiting Anesthetic complications: no  No notable events documented.  Last Vitals:  Vitals:   09/11/23 1624 09/11/23 2233  BP: 111/72   Pulse: 70 80  Resp: 17 16  Temp:    SpO2: 100% 100%    Last Pain:  Vitals:   09/11/23 1602  TempSrc:   PainSc: 0-No pain                 Lucinda Spells,W. EDMOND

## 2023-09-11 NOTE — ED Notes (Signed)
Pt went to c-t 

## 2023-09-11 NOTE — Anesthesia Procedure Notes (Signed)
Procedure Name: Intubation Date/Time: 09/11/2023 4:48 PM  Performed by: Rachel Moulds, CRNAPre-anesthesia Checklist: Patient identified, Emergency Drugs available, Suction available, Patient being monitored and Timeout performed Patient Re-evaluated:Patient Re-evaluated prior to induction Oxygen Delivery Method: Circle system utilized Preoxygenation: Pre-oxygenation with 100% oxygen Induction Type: IV induction, Rapid sequence and Cricoid Pressure applied Ventilation: Mask ventilation without difficulty Laryngoscope Size: Mac and 3 Grade View: Grade II Tube type: Oral Tube size: 8.0 mm Number of attempts: 1 Airway Equipment and Method: Stylet Placement Confirmation: breath sounds checked- equal and bilateral, CO2 detector, positive ETCO2 and ETT inserted through vocal cords under direct vision Secured at: 21 cm Tube secured with: Tape Dental Injury: Teeth and Oropharynx as per pre-operative assessment

## 2023-09-11 NOTE — Anesthesia Procedure Notes (Signed)
Arterial Line Insertion Start/End12/23/2024 4:30 PM, 09/11/2023 4:40 PM Performed by:  Nation, MD, anesthesiologist  Patient location: OR. Preanesthetic checklist: patient identified, IV checked, site marked, risks and benefits discussed, surgical consent, monitors and equipment checked, pre-op evaluation, timeout performed and anesthesia consent Lidocaine 1% used for infiltration Left, brachial was placed Catheter size: 20 G Hand hygiene performed  and maximum sterile barriers used   Attempts: 1 Procedure performed using ultrasound guided technique. Ultrasound Notes:anatomy identified, needle tip was noted to be adjacent to the nerve/plexus identified and no ultrasound evidence of intravascular and/or intraneural injection Following insertion, dressing applied. Post procedure assessment: normal and unchanged  Patient tolerated the procedure well with no immediate complications. Additional procedure comments: Unable to save ultrasound image due to emergent nature of case.

## 2023-09-12 ENCOUNTER — Encounter (HOSPITAL_COMMUNITY): Payer: Self-pay | Admitting: Surgery

## 2023-09-12 ENCOUNTER — Inpatient Hospital Stay (HOSPITAL_COMMUNITY): Payer: Medicare PPO

## 2023-09-12 LAB — BASIC METABOLIC PANEL
Anion gap: 8 (ref 5–15)
Anion gap: 8 (ref 5–15)
BUN: 13 mg/dL (ref 8–23)
BUN: 16 mg/dL (ref 8–23)
CO2: 23 mmol/L (ref 22–32)
CO2: 23 mmol/L (ref 22–32)
Calcium: 8 mg/dL — ABNORMAL LOW (ref 8.9–10.3)
Calcium: 8.2 mg/dL — ABNORMAL LOW (ref 8.9–10.3)
Chloride: 109 mmol/L (ref 98–111)
Chloride: 109 mmol/L (ref 98–111)
Creatinine, Ser: 0.99 mg/dL (ref 0.44–1.00)
Creatinine, Ser: 0.97 mg/dL (ref 0.44–1.00)
GFR, Estimated: >60 mL/min (ref >60)
GFR, Estimated: >60 mL/min (ref >60)
Glucose, Bld: 136 mg/dL — ABNORMAL HIGH (ref 70–99)
Glucose, Bld: 122 mg/dL — ABNORMAL HIGH (ref 70–99)
Potassium: 4.1 mmol/L (ref 3.5–5.1)
Potassium: 3.9 mmol/L (ref 3.5–5.1)
Sodium: 140 mmol/L (ref 135–145)
Sodium: 140 mmol/L (ref 135–145)

## 2023-09-12 LAB — BPAM CRYOPRECIPITATE
Blood Product Expiration Date: 202412282359
Blood Product Expiration Date: 202412282359
ISSUE DATE / TIME: 202412232048
ISSUE DATE / TIME: 202412232116
Unit Type and Rh: 6200
Unit Type and Rh: 6200

## 2023-09-12 LAB — POCT I-STAT 7, (LYTES, BLD GAS, ICA,H+H)
Acid-base deficit: 2 mmol/L (ref 0.0–2.0)
Acid-base deficit: 2 mmol/L (ref 0.0–2.0)
Bicarbonate: 22.8 mmol/L (ref 20.0–28.0)
Bicarbonate: 23 mmol/L (ref 20.0–28.0)
Calcium, Ion: 1.16 mmol/L (ref 1.15–1.40)
Calcium, Ion: 1.16 mmol/L (ref 1.15–1.40)
HCT: 28 % — ABNORMAL LOW (ref 36.0–46.0)
HCT: 26 % — ABNORMAL LOW (ref 36.0–46.0)
Hemoglobin: 9.5 g/dL — ABNORMAL LOW (ref 12.0–15.0)
Hemoglobin: 8.8 g/dL — ABNORMAL LOW (ref 12.0–15.0)
O2 Saturation: 98 %
O2 Saturation: 93 %
Patient temperature: 37.2
Patient temperature: 37.6
Potassium: 3.8 mmol/L (ref 3.5–5.1)
Potassium: 3.6 mmol/L (ref 3.5–5.1)
Sodium: 142 mmol/L (ref 135–145)
Sodium: 142 mmol/L (ref 135–145)
TCO2: 24 mmol/L (ref 22–32)
TCO2: 24 mmol/L (ref 22–32)
pCO2 arterial: 38 mm[Hg] (ref 32–48)
pCO2 arterial: 38.3 mm[Hg] (ref 32–48)
pH, Arterial: 7.386 (ref 7.35–7.45)
pH, Arterial: 7.389 (ref 7.35–7.45)
pO2, Arterial: 102 mm[Hg] (ref 83–108)
pO2, Arterial: 69 mm[Hg] — ABNORMAL LOW (ref 83–108)

## 2023-09-12 LAB — BPAM FFP
Blood Product Expiration Date: 202412252359
Blood Product Expiration Date: 202412282359
ISSUE DATE / TIME: 202412232050
ISSUE DATE / TIME: 202412232050
Unit Type and Rh: 6200
Unit Type and Rh: 6200

## 2023-09-12 LAB — CBC
HCT: 34.3 % — ABNORMAL LOW (ref 36.0–46.0)
HCT: 29.3 % — ABNORMAL LOW (ref 36.0–46.0)
Hemoglobin: 11.7 g/dL — ABNORMAL LOW (ref 12.0–15.0)
Hemoglobin: 10.1 g/dL — ABNORMAL LOW (ref 12.0–15.0)
MCH: 29.6 pg (ref 26.0–34.0)
MCH: 29.9 pg (ref 26.0–34.0)
MCHC: 34.1 g/dL (ref 30.0–36.0)
MCHC: 34.5 g/dL (ref 30.0–36.0)
MCV: 86.8 fL (ref 80.0–100.0)
MCV: 86.7 fL (ref 80.0–100.0)
Platelets: 153 10*3/uL (ref 150–400)
Platelets: 113 10*3/uL — ABNORMAL LOW (ref 150–400)
RBC: 3.95 MIL/uL (ref 3.87–5.11)
RBC: 3.38 MIL/uL — ABNORMAL LOW (ref 3.87–5.11)
RDW: 14.4 % (ref 11.5–15.5)
RDW: 14.8 % (ref 11.5–15.5)
WBC: 13.4 10*3/uL — ABNORMAL HIGH (ref 4.0–10.5)
WBC: 11.8 10*3/uL — ABNORMAL HIGH (ref 4.0–10.5)
nRBC: 0 % (ref 0.0–0.2)
nRBC: 0 % (ref 0.0–0.2)

## 2023-09-12 LAB — PREPARE FRESH FROZEN PLASMA
Unit division: 0
Unit division: 0

## 2023-09-12 LAB — POCT I-STAT EG7
Acid-base deficit: 3 mmol/L — ABNORMAL HIGH (ref 0.0–2.0)
Bicarbonate: 24.3 mmol/L (ref 20.0–28.0)
Calcium, Ion: 1.18 mmol/L (ref 1.15–1.40)
HCT: 28 % — ABNORMAL LOW (ref 36.0–46.0)
Hemoglobin: 9.5 g/dL — ABNORMAL LOW (ref 12.0–15.0)
O2 Saturation: 48 %
Patient temperature: 37.1
Potassium: 3.8 mmol/L (ref 3.5–5.1)
Sodium: 141 mmol/L (ref 135–145)
TCO2: 26 mmol/L (ref 22–32)
pCO2, Ven: 51.1 mm[Hg] (ref 44–60)
pH, Ven: 7.286 (ref 7.25–7.43)
pO2, Ven: 30 mm[Hg] — CL (ref 32–45)

## 2023-09-12 LAB — BPAM PLATELET PHERESIS
Blood Product Expiration Date: 202412242359
Blood Product Expiration Date: 202412242359
ISSUE DATE / TIME: 202412232049
ISSUE DATE / TIME: 202412232049
Unit Type and Rh: 6200
Unit Type and Rh: 9500

## 2023-09-12 LAB — GLUCOSE, CAPILLARY
Glucose-Capillary: 112 mg/dL — ABNORMAL HIGH (ref 70–99)
Glucose-Capillary: 120 mg/dL — ABNORMAL HIGH (ref 70–99)
Glucose-Capillary: 127 mg/dL — ABNORMAL HIGH (ref 70–99)
Glucose-Capillary: 127 mg/dL — ABNORMAL HIGH (ref 70–99)
Glucose-Capillary: 133 mg/dL — ABNORMAL HIGH (ref 70–99)
Glucose-Capillary: 139 mg/dL — ABNORMAL HIGH (ref 70–99)
Glucose-Capillary: 156 mg/dL — ABNORMAL HIGH (ref 70–99)
Glucose-Capillary: 160 mg/dL — ABNORMAL HIGH (ref 70–99)
Glucose-Capillary: 161 mg/dL — ABNORMAL HIGH (ref 70–99)
Glucose-Capillary: 163 mg/dL — ABNORMAL HIGH (ref 70–99)
Glucose-Capillary: 164 mg/dL — ABNORMAL HIGH (ref 70–99)
Glucose-Capillary: 180 mg/dL — ABNORMAL HIGH (ref 70–99)
Glucose-Capillary: 181 mg/dL — ABNORMAL HIGH (ref 70–99)

## 2023-09-12 LAB — PREPARE CRYOPRECIPITATE
Unit division: 0
Unit division: 0

## 2023-09-12 LAB — PREPARE PLATELET PHERESIS
Unit division: 0
Unit division: 0

## 2023-09-12 LAB — MAGNESIUM
Magnesium: 4.1 mg/dL — ABNORMAL HIGH (ref 1.7–2.4)
Magnesium: 2.7 mg/dL — ABNORMAL HIGH (ref 1.7–2.4)

## 2023-09-12 LAB — MRSA NEXT GEN BY PCR, NASAL: MRSA by PCR Next Gen: NOT DETECTED

## 2023-09-12 MED ORDER — INSULIN DETEMIR 100 UNIT/ML ~~LOC~~ SOLN
15.0000 [IU] | Freq: Every day | SUBCUTANEOUS | Status: DC
  Administered 2023-09-12: 15 [IU] via SUBCUTANEOUS
  Filled 2023-09-12 (×2): qty 0.15

## 2023-09-12 MED ORDER — INSULIN ASPART 100 UNIT/ML IJ SOLN: 0.0000 [IU] | INTRAMUSCULAR | Status: DC

## 2023-09-12 MED ORDER — DOCUSATE SODIUM 50 MG/5ML PO LIQD
200.0000 mg | Freq: Every day | ORAL | Status: DC
  Administered 2023-09-12 – 2023-09-13 (×2): 200 mg
  Filled 2023-09-12 (×2): qty 20

## 2023-09-12 MED ORDER — ORAL CARE MOUTH RINSE: 15.0000 mL | OROMUCOSAL | Status: DC | PRN

## 2023-09-12 MED ORDER — ASPIRIN 81 MG PO CHEW
81.0000 mg | CHEWABLE_TABLET | Freq: Every day | ORAL | Status: DC
  Administered 2023-09-12: 81 mg
  Filled 2023-09-12: qty 1

## 2023-09-12 MED ORDER — INSULIN ASPART 100 UNIT/ML IJ SOLN
0.0000 [IU] | INTRAMUSCULAR | Status: DC
  Administered 2023-09-12 – 2023-09-13 (×5): 2 [IU] via SUBCUTANEOUS

## 2023-09-12 MED ORDER — ASPIRIN 81 MG PO TBEC: 81.0000 mg | DELAYED_RELEASE_TABLET | Freq: Every day | ORAL | Status: DC

## 2023-09-12 MED ORDER — POTASSIUM CHLORIDE CRYS ER 20 MEQ PO TBCR
20.0000 meq | EXTENDED_RELEASE_TABLET | ORAL | Status: AC
  Administered 2023-09-12 (×3): 20 meq via ORAL
  Filled 2023-09-12 (×3): qty 1

## 2023-09-12 MED ORDER — ORAL CARE MOUTH RINSE
15.0000 mL | Freq: Three times a day (TID) | OROMUCOSAL | Status: DC
  Administered 2023-09-12: 15 mL via OROMUCOSAL

## 2023-09-12 MED ORDER — TRAMADOL HCL 50 MG PO TABS
50.0000 mg | ORAL_TABLET | ORAL | Status: DC | PRN
  Administered 2023-09-12 – 2023-09-14 (×4): 50 mg via ORAL
  Filled 2023-09-12 (×4): qty 1

## 2023-09-12 MED ORDER — FUROSEMIDE 10 MG/ML IJ SOLN
40.0000 mg | Freq: Two times a day (BID) | INTRAMUSCULAR | Status: AC
  Administered 2023-09-12 (×2): 40 mg via INTRAVENOUS
  Filled 2023-09-12 (×2): qty 4

## 2023-09-12 MED ORDER — LEVOTHYROXINE SODIUM 50 MCG PO TABS
50.0000 ug | ORAL_TABLET | Freq: Every day | ORAL | Status: DC
  Administered 2023-09-12 – 2023-09-13 (×2): 50 ug
  Filled 2023-09-12: qty 1

## 2023-09-12 MED FILL — Thrombin (Recombinant) For Soln 20000 Unit: CUTANEOUS | Qty: 1 | Status: AC

## 2023-09-12 NOTE — Procedures (Signed)
Extubation Procedure Note  Patient Details:   Name: Darlene Burns DOB: July 30, 1951 MRN: [id number]   Airway Documentation:    Vent end date: 09/12/23 Vent end time: 0955   Evaluation  O2 sats: stable throughout Complications: No apparent complications Patient did tolerate procedure well. Bilateral Breath Sounds: Clear, Diminished   Yes  Pt achieved a NIF of -40 and VC of 1.2 with great pt effort on all attempts. Pt extubated to 2L [place] per protocol. Cuff leak positive, no stridor noted, RN at bedside, RT will monitor as needed.   Thornell Mule 09/12/2023, 11:21 AM

## 2023-09-12 NOTE — Plan of Care (Signed)
  Problem: Coping: Goal: Level of anxiety will decrease Outcome: Progressing   Problem: Pain Management: Goal: General experience of comfort will improve Outcome: Progressing   Problem: Safety: Goal: Ability to remain free from injury will improve Outcome: Progressing   Problem: Skin Integrity: Goal: Risk for impaired skin integrity will decrease Outcome: Progressing   Problem: Cardiac: Goal: Will achieve and/or maintain hemodynamic stability Outcome: Progressing   Problem: Clinical Measurements: Goal: Postoperative complications will be avoided or minimized Outcome: Progressing   Problem: Respiratory: Goal: Respiratory status will improve Outcome: Progressing

## 2023-09-12 NOTE — Progress Notes (Signed)
1 Day Post-Op Procedure(s) (LRB): REPAIR OF ACUTE ASCENDING THORACIC AORTIC DISSECTION USING 30 MM HEMASHIELD PLATINUM (N/A) INTRAOPERATIVE TRANSESOPHAGEAL ECHOCARDIOGRAM (N/A) Subjective: Still drowsy on vent. Precedex is off.  Hemodynamics stable overnight.  Objective: Vital signs in last 24 hours: Temp:  [96.8 F (36 C)-99 F (37.2 C)] 98.6 F (37 C) (12/24 0645) Pulse Rate:  [46-113] 59 (12/24 0645) Cardiac Rhythm: Ventricular paced (12/24 0400) Resp:  [0-23] 14 (12/24 0645) BP: (80-200)/(56-138) 103/65 (12/24 0630) SpO2:  [91 %-100 %] 99 % (12/24 0645) Arterial Line BP: (87-128)/(46-80) 106/49 (12/24 0645) FiO2 (%):  [50 %] 50 % (12/24 0400) Weight:  [63 kg-72.4 kg] 72.4 kg (12/24 0221)  Hemodynamic parameters for last 24 hours: CVP:  [9 mmHg-13 mmHg] 9 mmHg  Intake/Output from previous day: 12/23 0701 - 12/24 0700 In: 6466.4 [I.V.:2413.8; Blood:2759; IV Piggyback:1293.6] Out: 3441 [Urine:1815; Emesis/NG output:50; Blood:800; Chest Tube:776] Intake/Output this shift: No intake/output data recorded.  General appearance: slowed mentation Neurologic: moving all ext to command Heart: regular rate and rhythm, no murmur Lungs: clear to auscultation bilaterally Abdomen: soft, non-tender; bowel sounds present Extremities: edema moderate Wound: dressings dry  Lab Results: Recent Labs    09/11/23 2241 09/12/23 0354  WBC 9.1 13.4*  HGB 9.9*  8.5* 11.7*  HCT 29.1*  25.0* 34.3*  PLT 120* 153   BMET:  Recent Labs    09/11/23 1147 09/11/23 1200 09/11/23 2102 09/11/23 2147 09/11/23 2241 09/12/23 0354  NA 141   < > 139   < > 142 140  K 4.4   < > 4.1   < > 3.6 4.1  CL 110   < > 104  --   --  109  CO2 25  --   --   --   --  23  GLUCOSE 114*   < > 163*  --   --  136*  BUN 16   < > 16  --   --  13  CREATININE 1.36*   < > 0.70  --   --  0.99  CALCIUM 8.8*  --   --   --   --  8.0*   < > = values in this interval not displayed.    PT/INR:  Recent Labs     09/11/23 2241  LABPROT 17.1*  INR 1.4*   ABG    Component Value Date/Time   PHART 7.451 (H) 09/11/2023 2241   HCO3 24.7 09/11/2023 2241   TCO2 26 09/11/2023 2241   ACIDBASEDEF 2.0 09/11/2023 2059   O2SAT 98 09/11/2023 2241   CBG (last 3)  Recent Labs    09/12/23 0502 09/12/23 0554 09/12/23 0643  GLUCAP 180* 163* 164*   CXR: small bilateral pleural effusions and bibasilar atelectasis.  ECG: sinus brady, no acute changes.  Assessment/Plan: S/P Procedure(s) (LRB): REPAIR OF ACUTE ASCENDING THORACIC AORTIC DISSECTION USING 30 MM HEMASHIELD PLATINUM (N/A) INTRAOPERATIVE TRANSESOPHAGEAL ECHOCARDIOGRAM (N/A)  POD 1 Hemodynamically stable on low dose neo. Rhythm is sinus brady 47. Will hold off beta blocker and AAI pace at 80 today. LV function normal.  Chest tube output has decreased as expected. Will keep in today.  Start diuresis. Wt is about 20 lbs over preop.  Hyperglycemia due to intra-op steroids. Will transition to Levemir and SSI.   Wean vent to extubate. Would expect her to be a little slow waking up due to anesthetic drugs and circ arrest.   LOS: 1 day    Darlene Burns 09/12/2023

## 2023-09-12 NOTE — Discharge Instructions (Signed)
Discharge Instructions:  1. You may shower, please wash incisions daily with soap and water and keep dry.  If you wish to cover wounds with dressing you may do so but please keep clean and change daily.  No tub baths or swimming until incisions have completely healed.  If your incisions become red or develop any drainage please call our office at [phone]  2. No Driving until cleared by Dr. Bartle's office and you are no longer using narcotic pain medications  3. Monitor your weight daily.. Please use the same scale and weigh at same time... If you gain 5-10 lbs in 48 hours with associated lower extremity swelling, please contact our office at [phone]  4. Fever of 101.5 for at least 24 hours with no source, please contact our office at [phone]  5. Activity- up as tolerated, please walk at least 3 times per day.  Avoid strenuous activity, no lifting, pushing, or pulling with your arms over 8-10 lbs for a minimum of 6 weeks  6. If any questions or concerns arise, please do not hesitate to contact our office at [phone]  

## 2023-09-12 NOTE — Discharge Summary (Incomplete)
[address]       [place] [address]             [phone]    Physician Discharge Summary  Patient ID: Darlene Burns MRN: [id number] DOB/AGE: 04/07/1951 72 y.o.  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   Paresthesia 07/04/2012   Discharge Diagnoses:  Patient Active Problem List   Diagnosis Date Noted   Thoracic aortic dissection (HCC) 09/11/2023   S/P aortic dissection repair 09/11/2023   History of chronic kidney disease 07/11/2019   History of hypothyroidism 07/11/2019   History of hyperlipidemia 07/11/2019   Diverticulosis 07/11/2019   Other insomnia 07/11/2019   Fibromyalgia 07/11/2019   DDD (degenerative disc disease), cervical 07/11/2019   DDD (degenerative disc disease), lumbar 07/11/2019   Dystonia 12/19/2013   Focal dystonia 07/04/2012   Paresthesia 07/04/2012    Discharged Condition: good  HPI:  Darlene Burns is a 72 year old woman with fibromyalgia and chronic degenerative spine disease who lives alone and is still relatively active taking care of her house and yard who developed acute onset of midsternal CP radiating to left jaw and shoulder about 10 am today. She also had pain between her shoulder blades but has chronic back pain so back pain not that unusual. She also developed SOB, nausea and vomited and diaphoresis so called EMS. BP was soft per EMS in the 90's. CTA chest showed acute type A dissection with a tear starting in mid ascending  aorta with clotted false lumen extending proximally to the aortic root and distally to the celiac artery. Her pain is better after IV dilaudid. No prior cardiac history.  Dr. Laneta Simmers reviewed the patient's diagnostic studies and determined she would benefit from surgical intervention. He reviewed the treatment options as well as the risks and benefits of surgery with the patient. Darlene Burns was agreeable to proceed with surgery.  Hospital Course:  Darlene Burns was emergently brought to the [facility] operating room on 12/23. She underwent repair of acute ascending thoracic aortic dissection utilizing Median Sternotomy, Extracorporeal circulation, Right Axillary Cannulation, and Supra Coronary Replacement of the Ascending Aorta hemi arch with a 30 mm Hemashield graft.. She tolerated the procedure well and was transferred to the SICU in stable condition.  The patient required Neo-synephrine post operatively.  She was weaned off as hemodynamics allowed.  She was started on Lasix to facilitate diuresis.  She was weaned and extubated on 12/24.  She developed leukocytosis which was not unexpected due to intra operative administration of steroids.  She developed Hypertension and NTG drip was initiated.  She was also started on oral regimen of Lopressor.  Her pacing wires and chest tubes were removed without difficulty.  She developed post operative thrombocytopenia and due to his Lovenox was not started.  The patient has been weaned off NTG drip.  She developed hypokalemia due to diuretics and was supplemented accordingly.  She required transfusion for post operative blood loss anemia, with improvement of Hgb to 9.5.  She was  also started on iron supplements.  She was evaluated by PT who recommended rehab.  She continued to make excellent progress and it was ultimately felt that she would be able to return home.  She was maintaining NSR and stable for transfer to the progressive care unit on 09/15/2023. CXR showed small  left greater than right pleural effusions. She was diuresed and started on Mucinex for congestion. Incentive spirometry and ambulation was encouraged. She was hypokalemic and aggressively supplemented.  Diuretics were discontinued prior to discharge.  She was ambulating well on room air. Her incisions were healing well without sign of infection. She was felt stable for discharge home with home health.  Consults: None  Significant Diagnostic Studies: radiology:   CT scan:    IMPRESSION: 1. Focal dissection or penetrating ulceration arising from the anterior aspect of the distal ascending thoracic aorta, with crescentic intramural hematoma extending proximally to the aortic root and distally along the length of the aortic arch and descending thoracic aorta to the level of the celiac axis origin. Maximum caliber of the ascending thoracic aorta 4.5 x 4.4 cm. 2. There appear to be elongated strands of thrombus within the celiac axis, common hepatic artery, and splenic artery, which do not appear to represent a dissection flap extending into the vessel. Aortic branch vessels are otherwise patent and the abdominal aorta is normal in caliber. 3. Coronary artery disease. 4. Descending and sigmoid diverticulosis without evidence of acute diverticulitis.   Critical findings discussed by telephone with Dr. Durwin Nora, 3:30 p.m., 09/11/2023.     Electronically Signed   By: Jearld Lesch M.D.   On: 09/11/2023 15:35  Treatments: surgery:   09/11/2023   Surgeon:  Alleen Borne, MD   First Assistant: Eduard Roux,  PA-C: An experienced assistant was required given the complexity of this surgery and the standard of surgical care. The assistant was needed for exposure, dissection, suctioning, retraction of delicate tissues and sutures, instrument exchange and for overall help during this procedure.      Preoperative Diagnosis:  Acute type A aortic dissection     Postoperative Diagnosis:   Same     Procedure: Emergent   Median Sternotomy Extracorporeal circulation Right axillary artery cannulation with 8 mm Hemashield graft 4.   Supra-coronary replacement of the ascending aorta (hemi-arch) using a 30 mm Hemashield graft under deep hypothermic circulatory arrest   Discharge Exam: Blood pressure 127/81, pulse 99, temperature 97.6 F (36.4 C), temperature source Oral, resp. rate 16, height 5\' 3"  (1.6 m), weight 65.5 kg, SpO2 90%.  General appearance: alert, cooperative, and no distress Heart: regular rate and rhythm Lungs: clear to auscultation bilaterally Abdomen: benign Extremities: no edema Wound: incis healing well  Discharge Medications:  The patient has been discharged on:   1.Beta Blocker:  Yes [ X  ]                              No   [   ]                              If No, reason:  2.Ace Inhibitor/ARB: Yes [   ]                                     No  [  n  ]                                     If No, reason:BP labile at times and overall well controlled, may need in future  3.Statin:   Yes [ X  ]                  No  [   ]                  If No, reason:  4.Ecasa:  Yes  [ X  ]                  No   [   ]                  If No, reason:  Patient had ACS upon admission: No  Plavix/P2Y12 inhibitor: Yes [   ]                                      No  [  X ]     Discharge Instructions     Discharge patient   Complete by: As directed    Discharge disposition: 01-Home or Self Care   Discharge patient date: 09/18/2023      Allergies as of 09/18/2023       Reactions   Amoxil [amoxicillin] Diarrhea   Prednisone Other (See Comments)   Unknown reaction   Trileptal [oxcarbazepine] Other (See Comments)   Unknown reaction   Bactrim [sulfamethoxazole-trimethoprim] Rash   Ovace Plus Wash [sulfacetamide Sodium] Rash   Sulfa Antibiotics Rash        Medication List     TAKE these medications    acetaminophen 325 MG tablet Commonly  known as: Tylenol Take 2 tablets (650 mg total) by mouth every 6 (six) hours as needed for mild pain (pain score 1-3).   ascorbic acid 500 MG tablet Commonly known as: VITAMIN C Take 500 mg by mouth every evening.   aspirin 81 MG chewable tablet Chew 1 tablet (81 mg total) by mouth daily.   cholecalciferol 25 MCG (1000 UNIT) tablet Commonly known as: VITAMIN D3 Take 1,000 Units by mouth every evening.   fluocinonide 0.05 % external solution Commonly known as: LIDEX Apply 1 Application topically at bedtime as needed (scalp irritation).   fluticasone 50 MCG/ACT nasal spray Commonly known as: FLONASE Place 1 spray into both nostrils daily as needed for allergies.   hydroxychloroquine 200 MG tablet Commonly known as: PLAQUENIL Take 200 mg by mouth 2 (two) times daily.   levothyroxine 50 MCG tablet Commonly known as: SYNTHROID Take 50 mcg by mouth daily before breakfast.   Magnesium 250 MG Tabs Take 250 mg by mouth every evening.   metoprolol tartrate 50 MG tablet Commonly known as: LOPRESSOR Take 1 tablet (50 mg total) by mouth 2 (two) times daily.   Multivitamin Women 50+ Tabs Take 1 tablet by mouth every evening.   oxyCODONE 5 MG immediate release tablet Commonly known as: Oxy IR/ROXICODONE Take 1 tablet (5 mg total) by mouth every 6 (six) hours as needed for up to 7 days for severe pain (pain score 7-10).   simvastatin 40 MG tablet Commonly known as: ZOCOR Take 40 mg by mouth every evening.   tacrolimus 0.1 %  ointment Commonly known as: PROTOPIC Apply 1 Application topically daily as needed (scalp irritation).   traZODone 100 MG tablet Commonly known as: DESYREL Take 50-100 mg by mouth at bedtime as needed for sleep.               Durable Medical Equipment  (From admission, onward)           Start     Ordered   09/15/23 1436  For home use only DME Walker rolling  Once       Question Answer Comment  Walker: With 5 Inch Wheels   Patient needs a  walker to treat with the following condition Physical deconditioning      09/15/23 1436            Follow-up Information     Alleen Borne, MD Follow up on 10/11/2023.   Specialty: Cardiothoracic Surgery Why: Appointment is at 9:00 Contact information: [address] [place] [address] [phone]         [facility] Follow up on 09/21/2023.   Specialty: Cardiothoracic Surgery Why: Appointment is for suture removal 10:00 Contact information: [address] [place], [address] [place] [address] [phone]        [facility] Follow up on 10/11/2023.   Why: Please get CXR at 8:00 prior to your appointment with Dr. Sharee Pimple office Contact information: [address] [place] [address]        Merri Brunette, MD Follow up.   Specialty: Family Medicine Why: hospital follow up appt scheduled for 09/22/2023 at 1000 am Contact information: [address] [place] [address] [phone]         [facility] Follow up.   Specialty: Home Health Services Why: Home Health Physical Therapy-agency will call to schedule visit Contact information: [address] [place] [address] [phone]         Jake Bathe, MD Follow up on 10/11/2023.   Specialty: Cardiology Why: Cardiology appointment is at 9:20AM Contact information: [address] [place] [address] [phone]                 Signed:  Rowe Clack, PA-C  09/18/2023, 2:29 PM

## 2023-09-13 ENCOUNTER — Inpatient Hospital Stay (HOSPITAL_COMMUNITY): Payer: Medicare PPO

## 2023-09-13 LAB — GLUCOSE, CAPILLARY
Glucose-Capillary: 108 mg/dL — ABNORMAL HIGH (ref 70–99)
Glucose-Capillary: 129 mg/dL — ABNORMAL HIGH (ref 70–99)
Glucose-Capillary: 131 mg/dL — ABNORMAL HIGH (ref 70–99)
Glucose-Capillary: 134 mg/dL — ABNORMAL HIGH (ref 70–99)
Glucose-Capillary: 140 mg/dL — ABNORMAL HIGH (ref 70–99)
Glucose-Capillary: 144 mg/dL — ABNORMAL HIGH (ref 70–99)
Glucose-Capillary: 177 mg/dL — ABNORMAL HIGH (ref 70–99)

## 2023-09-13 LAB — CBC
HCT: 27.1 % — ABNORMAL LOW (ref 36.0–46.0)
Hemoglobin: 9.4 g/dL — ABNORMAL LOW (ref 12.0–15.0)
MCH: 30.3 pg (ref 26.0–34.0)
MCHC: 34.7 g/dL (ref 30.0–36.0)
MCV: 87.4 fL (ref 80.0–100.0)
Platelets: 112 10*3/uL — ABNORMAL LOW (ref 150–400)
RBC: 3.1 MIL/uL — ABNORMAL LOW (ref 3.87–5.11)
RDW: 15.2 % (ref 11.5–15.5)
WBC: 12.5 10*3/uL — ABNORMAL HIGH (ref 4.0–10.5)
nRBC: 0 % (ref 0.0–0.2)

## 2023-09-13 LAB — BASIC METABOLIC PANEL
Anion gap: 7 (ref 5–15)
BUN: 19 mg/dL (ref 8–23)
CO2: 25 mmol/L (ref 22–32)
Calcium: 8.1 mg/dL — ABNORMAL LOW (ref 8.9–10.3)
Chloride: 109 mmol/L (ref 98–111)
Creatinine, Ser: 1.04 mg/dL — ABNORMAL HIGH (ref 0.44–1.00)
GFR, Estimated: 57 mL/min — ABNORMAL LOW (ref >60)
Glucose, Bld: 143 mg/dL — ABNORMAL HIGH (ref 70–99)
Potassium: 4.2 mmol/L (ref 3.5–5.1)
Sodium: 141 mmol/L (ref 135–145)

## 2023-09-13 MED ORDER — SODIUM CHLORIDE 0.9 % IV SOLN: INTRAVENOUS | Status: AC | PRN

## 2023-09-13 MED ORDER — AMLODIPINE BESYLATE 10 MG PO TABS
10.0000 mg | ORAL_TABLET | Freq: Every day | ORAL | Status: DC
  Administered 2023-09-13: 10 mg via ORAL
  Filled 2023-09-13: qty 1

## 2023-09-13 MED ORDER — DOCUSATE SODIUM 100 MG PO CAPS
200.0000 mg | ORAL_CAPSULE | Freq: Two times a day (BID) | ORAL | Status: DC
  Administered 2023-09-13 – 2023-09-15 (×4): 200 mg via ORAL
  Filled 2023-09-13 (×6): qty 2

## 2023-09-13 MED ORDER — LEVOTHYROXINE SODIUM 50 MCG PO TABS
50.0000 ug | ORAL_TABLET | Freq: Every day | ORAL | Status: DC
  Administered 2023-09-14 – 2023-09-18 (×5): 50 ug via ORAL
  Filled 2023-09-13 (×5): qty 1

## 2023-09-13 MED ORDER — FUROSEMIDE 10 MG/ML IJ SOLN
40.0000 mg | Freq: Once | INTRAMUSCULAR | Status: AC
  Administered 2023-09-13: 40 mg via INTRAVENOUS
  Filled 2023-09-13: qty 4

## 2023-09-13 MED ORDER — ASPIRIN 81 MG PO CHEW
81.0000 mg | CHEWABLE_TABLET | Freq: Every day | ORAL | Status: DC
  Administered 2023-09-14 – 2023-09-18 (×5): 81 mg via ORAL
  Filled 2023-09-13 (×5): qty 1

## 2023-09-13 MED ORDER — METOPROLOL TARTRATE 25 MG PO TABS
25.0000 mg | ORAL_TABLET | Freq: Two times a day (BID) | ORAL | Status: DC
  Administered 2023-09-13 (×2): 25 mg via ORAL
  Filled 2023-09-13 (×2): qty 1

## 2023-09-13 MED ORDER — INSULIN ASPART 100 UNIT/ML IJ SOLN
0.0000 [IU] | Freq: Three times a day (TID) | INTRAMUSCULAR | Status: DC
  Administered 2023-09-13: 3 [IU] via SUBCUTANEOUS
  Administered 2023-09-13: 2 [IU] via SUBCUTANEOUS

## 2023-09-13 NOTE — Progress Notes (Signed)
   09/13/23 0800  Spiritual Encounters  Type of Visit Initial  Care provided to: Patient;Pt and family  Referral source Patient request  Reason for visit Routine spiritual support  OnCall Visit No  Spiritual Framework  Presenting Themes Meaning/purpose/sources of inspiration;Coping tools;Impactful experiences and emotions;Rituals and practive;Community and relationships  Values/beliefs Christian Faith  Community/Connection Family;Faith community;Spiritual leader  Patient Stress Factors Lack of knowledge;Loss of control;Major life changes  Family Stress Factors Loss of control;Major life changes  Interventions  Spiritual Care Interventions Made Established relationship of care and support;Compassionate presence;Normalization of emotions;Reflective listening;Explored values/beliefs/practices/strengths;Meaning making;Prayer   Chaplain visitied patient today at patient's request from local [facility] in [place].  Provided spiritual care and comfort to [place] and her family at bedisde.   Actively engaged in provided assessment.  Spiritual assessment of lifes new purpose post medicaal incident this week.  Patient and family were appreciative of Chaplain visit.    Respectfully submitted,  Rev Amaryllis Dyke

## 2023-09-13 NOTE — Progress Notes (Signed)
2 Days Post-Op Procedure(s) (LRB): REPAIR OF ACUTE ASCENDING THORACIC AORTIC DISSECTION USING 30 MM HEMASHIELD PLATINUM (N/A) INTRAOPERATIVE TRANSESOPHAGEAL ECHOCARDIOGRAM (N/A) Subjective: Some incisional pain, no nausea but poor appetite  Objective: Vital signs in last 24 hours: Temp:  [98.6 F (37 C)-100.4 F (38 C)] 100 F (37.8 C) (12/25 0645) Pulse Rate:  [70-103] 101 (12/25 0645) Cardiac Rhythm: Normal sinus rhythm;Sinus tachycardia (12/25 0400) Resp:  [0-26] 15 (12/25 0630) BP: (102-145)/(58-91) 115/76 (12/25 0600) SpO2:  [92 %-100 %] 96 % (12/25 0645) Arterial Line BP: (101-163)/(46-76) 101/60 (12/25 0645) FiO2 (%):  [40 %] 40 % (12/24 0822) Weight:  [69.3 kg] 69.3 kg (12/25 0500)  Hemodynamic parameters for last 24 hours: CVP:  [0 mmHg-14 mmHg] 14 mmHg  Intake/Output from previous day: 12/24 0701 - 12/25 0700 In: 593.2 [I.V.:393.2; IV Piggyback:200] Out: 1996 [Urine:1776; Chest Tube:220] Intake/Output this shift: No intake/output data recorded.  General appearance: alert, cooperative, and no distress Neurologic: intact Heart: tachy, regular Lungs: diminished breath sounds bibasilar Abdomen: normal findings: soft, non-tender  Lab Results: Recent Labs    09/12/23 1650 09/13/23 0345  WBC 11.8* 12.5*  HGB 10.1* 9.4*  HCT 29.3* 27.1*  PLT 113* 112*   BMET:  Recent Labs    09/12/23 1650 09/13/23 0345  NA 140 141  K 3.9 4.2  CL 109 109  CO2 23 25  GLUCOSE 122* 143*  BUN 16 19  CREATININE 0.97 1.04*  CALCIUM 8.2* 8.1*    PT/INR:  Recent Labs    09/11/23 2241  LABPROT 17.1*  INR 1.4*   ABG    Component Value Date/Time   PHART 7.389 09/12/2023 1142   HCO3 23.0 09/12/2023 1142   TCO2 24 09/12/2023 1142   ACIDBASEDEF 2.0 09/12/2023 1142   O2SAT 93 09/12/2023 1142   CBG (last 3)  Recent Labs    09/12/23 1947 09/13/23 0100 09/13/23 0338  GLUCAP 120* 129* 131*    Assessment/Plan: S/P Procedure(s) (LRB): REPAIR OF ACUTE ASCENDING  THORACIC AORTIC DISSECTION USING 30 MM HEMASHIELD PLATINUM (N/A) INTRAOPERATIVE TRANSESOPHAGEAL ECHOCARDIOGRAM (N/A) POD # 2 NEURO- intact CV- in sinus tachy, on NTG drip for hypertension  Will start Lopressor today  Wean NTG drip  Dc pacing wires RESP_ IS for atelectasis RENAL_ diuresed well yesterday  Still above preop weight  Will give IV Lasix again today ENDO_ CBG well controlled  Change SSI to Ac and HS GI- diet as tolerated DC chest tubes SCD for DVT prophylaxis  Will hold off on enoxaparin due to borderline PLT count Mobilize   LOS: 2 days    Loreli Slot 09/13/2023

## 2023-09-13 NOTE — Progress Notes (Signed)
      [address]       ,[place] [address]             [phone]      POD # 2  Up in chair  BP 136/79   Pulse 78   Temp 98.6 F (37 C) (Oral)   Resp 17   Ht 5\' 3"  (1.6 m)   Wt 69.3 kg   SpO2 95%   BMI 27.06 kg/m   Still on iv NTG   Intake/Output Summary (Last 24 hours) at 09/13/2023 1728 Last data filed at 09/13/2023 1657 Gross per 24 hour  Intake 366.01 ml  Output 2230 ml  Net -1863.99 ml   Diuresing well  Viviann Spare C. Dorris Fetch, MD [facility] [phone]

## 2023-09-14 ENCOUNTER — Inpatient Hospital Stay (HOSPITAL_COMMUNITY): Payer: Medicare PPO

## 2023-09-14 LAB — BASIC METABOLIC PANEL
Anion gap: 5 (ref 5–15)
BUN: 21 mg/dL (ref 8–23)
CO2: 28 mmol/L (ref 22–32)
Calcium: 8 mg/dL — ABNORMAL LOW (ref 8.9–10.3)
Chloride: 107 mmol/L (ref 98–111)
Creatinine, Ser: 0.86 mg/dL (ref 0.44–1.00)
GFR, Estimated: >60 mL/min (ref >60)
Glucose, Bld: 126 mg/dL — ABNORMAL HIGH (ref 70–99)
Potassium: 3.9 mmol/L (ref 3.5–5.1)
Sodium: 140 mmol/L (ref 135–145)

## 2023-09-14 LAB — CBC
HCT: 23.9 % — ABNORMAL LOW (ref 36.0–46.0)
Hemoglobin: 8.1 g/dL — ABNORMAL LOW (ref 12.0–15.0)
MCH: 30.2 pg (ref 26.0–34.0)
MCHC: 33.9 g/dL (ref 30.0–36.0)
MCV: 89.2 fL (ref 80.0–100.0)
Platelets: 87 10*3/uL — ABNORMAL LOW (ref 150–400)
RBC: 2.68 MIL/uL — ABNORMAL LOW (ref 3.87–5.11)
RDW: 14.9 % (ref 11.5–15.5)
WBC: 11 10*3/uL — ABNORMAL HIGH (ref 4.0–10.5)
nRBC: 0 % (ref 0.0–0.2)

## 2023-09-14 LAB — GLUCOSE, CAPILLARY
Glucose-Capillary: 112 mg/dL — ABNORMAL HIGH (ref 70–99)
Glucose-Capillary: 156 mg/dL — ABNORMAL HIGH (ref 70–99)
Glucose-Capillary: 128 mg/dL — ABNORMAL HIGH (ref 70–99)
Glucose-Capillary: 77 mg/dL (ref 70–99)
Glucose-Capillary: 131 mg/dL — ABNORMAL HIGH (ref 70–99)
Glucose-Capillary: 130 mg/dL — ABNORMAL HIGH (ref 70–99)

## 2023-09-14 LAB — SURGICAL PATHOLOGY

## 2023-09-14 LAB — ECHO INTRAOPERATIVE TEE
AV Mean grad: 4 mm[Hg]
AV Peak grad: 7.1 mm[Hg]
Ao pk vel: 1.33 m/s
Area-P 1/2: 3.21 cm2
Height: 63 in
Weight: 2222.24 [oz_av]

## 2023-09-14 MED ORDER — METOLAZONE 2.5 MG PO TABS
2.5000 mg | ORAL_TABLET | Freq: Once | ORAL | Status: AC
  Administered 2023-09-14: 2.5 mg via ORAL
  Filled 2023-09-14: qty 1

## 2023-09-14 MED ORDER — METOPROLOL TARTRATE 50 MG PO TABS
50.0000 mg | ORAL_TABLET | Freq: Two times a day (BID) | ORAL | Status: DC
Start: 2023-09-14 — End: 2023-09-18
  Administered 2023-09-14 – 2023-09-18 (×9): 50 mg via ORAL
  Filled 2023-09-14 (×9): qty 1

## 2023-09-14 MED ORDER — POTASSIUM CHLORIDE CRYS ER 20 MEQ PO TBCR
20.0000 meq | EXTENDED_RELEASE_TABLET | Freq: Two times a day (BID) | ORAL | Status: AC
  Administered 2023-09-14 (×2): 20 meq via ORAL
  Filled 2023-09-14 (×2): qty 1

## 2023-09-14 MED ORDER — FE FUM-VIT C-VIT B12-FA 460-60-0.01-1 MG PO CAPS
1.0000 | ORAL_CAPSULE | Freq: Every day | ORAL | Status: DC
  Administered 2023-09-14 – 2023-09-18 (×5): 1 via ORAL
  Filled 2023-09-14 (×5): qty 1

## 2023-09-14 MED ORDER — FUROSEMIDE 10 MG/ML IJ SOLN
40.0000 mg | Freq: Two times a day (BID) | INTRAMUSCULAR | Status: AC
  Administered 2023-09-14 (×2): 40 mg via INTRAVENOUS
  Filled 2023-09-14 (×2): qty 4

## 2023-09-14 NOTE — TOC Initial Note (Addendum)
Transition of Care [facility]) - Initial/Assessment Note    Patient Details  Name: Darlene Burns MRN: [id number] Date of Birth: [birth year]  Transition of Care [facility]) CM/SW Contact:    Darlene Cousin, RN Phone Number: [phone] 09/14/2023, 1:19 PM  Clinical Narrative:                 CM spoke to pt and friend at bedside. Pt states she lives at home alone. States her sister is planning to come and stay with her post dc. Pt states she would be interested in IP rehab if recommended. Waiting PT evaluation and recommendations.  Offered choice for HH ([email or web address]) list placed on chart and provided to pt.  Pt reports having a cane at home.  Will need HH orders HH RN and PT with [facility]. Contacted [facility] rep, Darlene Burns with new referral.   Expected Discharge Plan: Home w Home Health Services Barriers to Discharge: Continued Medical Work up   Patient Goals and CMS Choice Patient states their goals for this hospitalization and ongoing recovery are:: wants to recover CMS Medicare.gov Compare Post Acute Care list provided to:: Patient Choice offered to / list presented to : Patient      Expected Discharge Plan and Services   Discharge Planning Services: CM Consult Post Acute Care Choice: Home Health Living arrangements for the past 2 months: Single Family Home                           HH Arranged: RN, PT [facility] Agency: [facility] Date [facility] Agency Contacted: 09/14/23 Time HH Agency Contacted: 1318 Representative spoke with at [facility] Agency: Darlene Burns  Prior Living Arrangements/Services Living arrangements for the past 2 months: Single Family Home Lives with:: Self Patient language and need for interpreter reviewed:: Yes Do you feel safe going back to the place where you live?: Yes      Need for Family Participation in Patient Care: Yes (Comment) Care giver support system in place?: Yes (comment) Current home services: DME (cane) Criminal Activity/Legal Involvement  Pertinent to Current Situation/Hospitalization: No - Comment as needed  Activities of Daily Living      Permission Sought/Granted Permission sought to share information with : Case Manager, Family Supports, PCP Permission granted to share information with : Yes, Verbal Permission Granted  Share Information with NAME: Darlene Burns  Permission granted to share info w AGENCY: Home Health  Permission granted to share info w Relationship: sister  Permission granted to share info w Contact Information: [phone]  Emotional Assessment Appearance:: Appears stated age Attitude/Demeanor/Rapport: Engaged Affect (typically observed): Accepting Orientation: : Oriented to Self, Oriented to Place, Oriented to  Time, Oriented to Situation   Psych Involvement: No (comment)  Admission diagnosis:  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) [I71.010] Thoracic aortic dissection (HCC) [I71.019] S/P aortic dissection repair [M57.846] Patient Active Problem List   Diagnosis Date Noted   Thoracic aortic dissection (HCC) 09/11/2023   S/P aortic dissection repair 09/11/2023   History of chronic kidney disease 07/11/2019   History of hypothyroidism 07/11/2019   History of hyperlipidemia 07/11/2019   Diverticulosis 07/11/2019   Other insomnia 07/11/2019   Fibromyalgia 07/11/2019   DDD (degenerative disc disease), cervical 07/11/2019   DDD (degenerative disc disease), lumbar 07/11/2019   Dystonia 12/19/2013   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 [place] [address] Phone: [phone] Fax: [phone]     Social Drivers of Health (SDOH) Social History: SDOH Screenings   Food Insecurity: No Food Insecurity (09/14/2023)  Housing: Unknown (09/14/2023)  Transportation Needs: No Transportation Needs (09/14/2023)  Utilities:  Not At Risk (09/14/2023)  Tobacco Use: Low Risk  (09/11/2023)   SDOH Interventions: Food Insecurity Interventions: Intervention Not Indicated Housing Interventions: Intervention Not Indicated Transportation Interventions: Intervention Not Indicated Utilities Interventions: Intervention Not Indicated   Readmission Risk Interventions     No data to display

## 2023-09-14 NOTE — Progress Notes (Signed)
3 Days Post-Op Procedure(s) (LRB): REPAIR OF ACUTE ASCENDING THORACIC AORTIC DISSECTION USING 30 MM HEMASHIELD PLATINUM (N/A) INTRAOPERATIVE TRANSESOPHAGEAL ECHOCARDIOGRAM (N/A) Subjective:  No complaints. Eating breakfast.  Objective: Vital signs in last 24 hours: Temp:  [98.4 F (36.9 C)-99.9 F (37.7 C)] 98.4 F (36.9 C) (12/26 0000) Pulse Rate:  [71-106] 90 (12/26 0700) Cardiac Rhythm: Normal sinus rhythm (12/26 0000) Resp:  [0-32] 17 (12/26 0700) BP: (101-137)/(55-79) 101/76 (12/26 0700) SpO2:  [92 %-98 %] 95 % (12/26 0700) Arterial Line BP: (105-138)/(47-119) 137/119 (12/26 0700) Weight:  [70.6 kg] 70.6 kg (12/26 0500)  Hemodynamic parameters for last 24 hours:    Intake/Output from previous day: 12/25 0701 - 12/26 0700 In: 248.7 [I.V.:248.7] Out: 1020 [Urine:1000; Chest Tube:20] Intake/Output this shift: No intake/output data recorded.  General appearance: alert and cooperative Neurologic: intact Heart: regular rate and rhythm, S1, S2 normal, no murmur Lungs: clear to auscultation bilaterally Extremities: edema mild Wound: incisions look good.  Lab Results: Recent Labs    09/13/23 0345 09/14/23 0416  WBC 12.5* 11.0*  HGB 9.4* 8.1*  HCT 27.1* 23.9*  PLT 112* 87*   BMET:  Recent Labs    09/13/23 0345 09/14/23 0416  NA 141 140  K 4.2 3.9  CL 109 107  CO2 25 28  GLUCOSE 143* 126*  BUN 19 21  CREATININE 1.04* 0.86  CALCIUM 8.1* 8.0*    PT/INR:  Recent Labs    09/11/23 2241  LABPROT 17.1*  INR 1.4*   ABG    Component Value Date/Time   PHART 7.389 09/12/2023 1142   HCO3 23.0 09/12/2023 1142   TCO2 24 09/12/2023 1142   ACIDBASEDEF 2.0 09/12/2023 1142   O2SAT 93 09/12/2023 1142   CBG (last 3)  Recent Labs    09/13/23 1932 09/13/23 2340 09/14/23 0418  GLUCAP 140* 134* 112*   CXR: small pleural effusions and bibasilar atelectasis  Assessment/Plan: S/P Procedure(s) (LRB): REPAIR OF ACUTE ASCENDING THORACIC AORTIC DISSECTION USING 30  MM HEMASHIELD PLATINUM (N/A) INTRAOPERATIVE TRANSESOPHAGEAL ECHOCARDIOGRAM (N/A)  POD 3 Hemodynamically stable. Rhythm is sinus 90's. Will increase Lopressor to 50 bid.  Continue diuresis. Wt about 16 lbs over preop if accurate.  Glucose under good control. DC CBG's and SSI.  DC arterial line and sleeve.  IS, OOB. Will have PT see her.   LOS: 3 days    Alleen Borne 09/14/2023

## 2023-09-15 LAB — TYPE AND SCREEN
ABO/RH(D): A NEG
Antibody Screen: NEGATIVE
Unit division: 0
Unit division: 0
Unit division: 0
Unit division: 0
Unit division: 0
Unit division: 0

## 2023-09-15 LAB — BPAM RBC
Blood Product Expiration Date: 202501232359
Blood Product Expiration Date: 202501252359
Blood Product Expiration Date: 202501262359
Blood Product Expiration Date: 202501272359
Blood Product Expiration Date: 202501272359
Blood Product Expiration Date: 202501272359
ISSUE DATE / TIME: 202412231710
ISSUE DATE / TIME: 202412231710
ISSUE DATE / TIME: 202412231710
ISSUE DATE / TIME: 202412240007
Unit Type and Rh: 600
Unit Type and Rh: 600
Unit Type and Rh: 600
Unit Type and Rh: 600
Unit Type and Rh: 600
Unit Type and Rh: 600

## 2023-09-15 LAB — GLUCOSE, CAPILLARY
Glucose-Capillary: 141 mg/dL — ABNORMAL HIGH (ref 70–99)
Glucose-Capillary: 116 mg/dL — ABNORMAL HIGH (ref 70–99)

## 2023-09-15 LAB — CBC
HCT: 28.4 % — ABNORMAL LOW (ref 36.0–46.0)
Hemoglobin: 9.5 g/dL — ABNORMAL LOW (ref 12.0–15.0)
MCH: 29.8 pg (ref 26.0–34.0)
MCHC: 33.5 g/dL (ref 30.0–36.0)
MCV: 89 fL (ref 80.0–100.0)
Platelets: 110 10*3/uL — ABNORMAL LOW (ref 150–400)
RBC: 3.19 MIL/uL — ABNORMAL LOW (ref 3.87–5.11)
RDW: 14.1 % (ref 11.5–15.5)
WBC: 11 10*3/uL — ABNORMAL HIGH (ref 4.0–10.5)
nRBC: 0 % (ref 0.0–0.2)

## 2023-09-15 LAB — BASIC METABOLIC PANEL
Anion gap: 9 (ref 5–15)
BUN: 18 mg/dL (ref 8–23)
CO2: 32 mmol/L (ref 22–32)
Calcium: 8.5 mg/dL — ABNORMAL LOW (ref 8.9–10.3)
Chloride: 96 mmol/L — ABNORMAL LOW (ref 98–111)
Creatinine, Ser: 0.82 mg/dL (ref 0.44–1.00)
GFR, Estimated: >60 mL/min (ref >60)
Glucose, Bld: 129 mg/dL — ABNORMAL HIGH (ref 70–99)
Potassium: 3.1 mmol/L — ABNORMAL LOW (ref 3.5–5.1)
Sodium: 137 mmol/L (ref 135–145)

## 2023-09-15 LAB — HEMOGLOBIN A1C
Hgb A1c MFr Bld: 5.6 % (ref 4.8–5.6)
Mean Plasma Glucose: 114 mg/dL

## 2023-09-15 MED ORDER — POTASSIUM CHLORIDE 20 MEQ PO PACK
20.0000 meq | PACK | ORAL | Status: AC
  Administered 2023-09-15: 20 meq via ORAL
  Filled 2023-09-15: qty 1

## 2023-09-15 MED ORDER — FUROSEMIDE 40 MG PO TABS
40.0000 mg | ORAL_TABLET | Freq: Every day | ORAL | Status: DC
  Administered 2023-09-15 – 2023-09-16 (×2): 40 mg via ORAL
  Filled 2023-09-15 (×2): qty 1

## 2023-09-15 MED ORDER — LACTULOSE 10 GM/15ML PO SOLN: 20.0000 g | Freq: Every day | ORAL | Status: DC | PRN

## 2023-09-15 MED ORDER — POTASSIUM CHLORIDE 20 MEQ PO PACK
20.0000 meq | PACK | ORAL | Status: DC
  Administered 2023-09-15 (×2): 20 meq
  Filled 2023-09-15 (×2): qty 1

## 2023-09-15 NOTE — Progress Notes (Addendum)
      [address]       [place] [address]             [phone]      4 Days Post-Op Procedure(s) (LRB): REPAIR OF ACUTE ASCENDING THORACIC AORTIC DISSECTION USING 30 MM HEMASHIELD PLATINUM (N/A) INTRAOPERATIVE TRANSESOPHAGEAL ECHOCARDIOGRAM (N/A)  Subjective:  Patient sitting up in chair.  Overall feels like she is doing well.  Her pain is well controlled.  Denies N/V.  Has not yet moved her bowels  Objective: Vital signs in last 24 hours: Temp:  [97.7 F (36.5 C)-99 F (37.2 C)] 98.6 F (37 C) (12/26 2331) Pulse Rate:  [69-94] 82 (12/27 0500) Cardiac Rhythm: Normal sinus rhythm (12/26 2000) Resp:  [0-31] 17 (12/27 0500) BP: (104-160)/(61-118) 137/74 (12/27 0500) SpO2:  [85 %-100 %] 94 % (12/27 0500) Arterial Line BP: (108-126)/(46-59) 126/57 (12/26 0930) Weight:  [67.5 kg] 67.5 kg (12/27 0326)  Intake/Output from previous day: 12/26 0701 - 12/27 0700 In: 10 [I.V.:10] Out: 2425 [Urine:2425]  General appearance: alert, cooperative, and no distress Heart: regular rate and rhythm Lungs: clear to auscultation bilaterally Abdomen: soft, non-tender; bowel sounds normal; no masses,  no organomegaly Extremities: edema trace Wound: clean and dry  Lab Results: Recent Labs    09/14/23 0416 09/15/23 0242  WBC 11.0* 11.0*  HGB 8.1* 9.5*  HCT 23.9* 28.4*  PLT 87* 110*   BMET:  Recent Labs    09/14/23 0416 09/15/23 0242  NA 140 137  K 3.9 3.1*  CL 107 96*  CO2 28 32  GLUCOSE 126* 129*  BUN 21 18  CREATININE 0.86 0.82  CALCIUM 8.0* 8.5*    PT/INR: No results for input(s): "LABPROT", "INR" in the last 72 hours. ABG    Component Value Date/Time   PHART 7.389 09/12/2023 1142   HCO3 23.0 09/12/2023 1142   TCO2 24 09/12/2023 1142   ACIDBASEDEF 2.0 09/12/2023 1142   O2SAT 93 09/12/2023 1142   CBG (last 3)  Recent Labs    09/14/23 1943 09/14/23 2109 09/15/23 0626  GLUCAP 131* 130* 141*    Assessment/Plan: S/P Procedure(s)  (LRB): REPAIR OF ACUTE ASCENDING THORACIC AORTIC DISSECTION USING 30 MM HEMASHIELD PLATINUM (N/A) INTRAOPERATIVE TRANSESOPHAGEAL ECHOCARDIOGRAM (N/A)  CV- NSR, + HTN- continue Lopressor 50 mg BID Pulm- wean oxygen as tolerated, continue IS Renal- creatinine stable, weight is trending down, transition to oral Lasix today Hypokalemia- due to aggressive IV diuretics, supplementation ordered GI- constipation, mild passing gas. Continue stool softners, add Lactulose prn Post operative blood loss anemia, mild responded well to transfusion, Hgb at 9.1 Deconditioning- PT recs rehab-- placed CIR consult, hopefully they feel she is a candidate Dispo- patient stable, CIR consult, supplement potassium, continue diuretics, transfer to 4E   LOS: 4 days  Lowella Dandy, PA-C 09/15/2023   Chart reviewed, patient examined, agree with above.  She looks great overall. Wt is still 10 lbs over preop but no edema. Will continue gentle diuresis and replace K+. Planning CIR eval and if not then I think she can go home with her sister helping her.

## 2023-09-15 NOTE — Evaluation (Signed)
Physical Therapy Evaluation Patient Details Name: Darlene Burns MRN: [id number] DOB: 1950/12/17 Today's Date: 09/15/2023  History of Present Illness  Pt presented to [facility] ED due to chest pain with associated diaphoresis, emesis and shortness of breath. Pt is currently s/p repair of acute ascending thoracic aortic dissection. PMH: fibromyalgia, HLD, arthritis.  Clinical Impression  Pt is presenting below baseline level of funcitoning. Prior to hospitalization pt was active and independent. Currently pt is Min A to CGA with all functional activities including bed mobility, sit to stand and gait with AD. Pt has difficulty remembering sternal precautions and requires frequent reminders throughout to protect the integrity of surgical site. Due to pt current functional status, home set up and available assistance at home recommending skilled physical therapy services 3x/week in order to address strength, balance and functional mobility to decrease risk for falls, injury and re-hospitalization.           If plan is discharge home, recommend the following: Help with stairs or ramp for entrance;Assist for transportation;Assistance with cooking/housework     Equipment Recommendations Rolling walker (2 wheels)     Functional Status Assessment Patient has had a recent decline in their functional status and demonstrates the ability to make significant improvements in function in a reasonable and predictable amount of time.     Precautions / Restrictions Precautions Precautions: Sternal Precaution Booklet Issued: No Precaution Comments: pt is able to state all precautions Restrictions Weight Bearing Restrictions Per Provider Order: Yes RUE Weight Bearing Per Provider Order: Non weight bearing LUE Weight Bearing Per Provider Order: Non weight bearing      Mobility  Bed Mobility Overal bed mobility: Needs Assistance Bed Mobility: Sit to Supine       Sit to supine: Min assist   General bed  mobility comments: LE and verbal cues to prevent reaching, and pulling.    Transfers Overall transfer level: Needs assistance Equipment used: None Transfers: Sit to/from Stand Sit to Stand: Contact guard assist           General transfer comment: CGA. Pt was very faint looking, denied dizziness, lightheadedness.    Ambulation/Gait Ambulation/Gait assistance: Contact guard assist Gait Distance (Feet): 120 Feet Assistive device: Rolling walker (2 wheels), 1 person hand held assist Gait Pattern/deviations: Step-through pattern, Decreased stride length, Staggering left, Staggering right Gait velocity: decreased Gait velocity interpretation: <1.31 ft/sec, indicative of household ambulator   General Gait Details: initially pt was staggering R/L provided HHA which improved and then provided with RW with significant improvement.     Balance Overall balance assessment: Needs assistance Sitting-balance support: Bilateral upper extremity supported Sitting balance-Leahy Scale: Fair Sitting balance - Comments: sitting EOB   Standing balance support: No upper extremity supported, Bilateral upper extremity supported, Single extremity supported, During functional activity, Reliant on assistive device for balance Standing balance-Leahy Scale: Fair Standing balance comment: reliant on AD for balance to prevent falls         Pertinent Vitals/Pain Pain Assessment Pain Assessment: No/denies pain (reports pain with movement)    Home Living Family/patient expects to be discharged to:: Private residence Living Arrangements: Alone Available Help at Discharge: Family;Available 24 hours/day;Available PRN/intermittently;Neighbor (sister is coming to help for about  week after pt leaves and neighbor who can come by occasionally) Type of Home: House Home Access: Stairs to enter Entrance Stairs-Rails: Left Entrance Stairs-Number of Steps: 3   Home Layout: Laundry or work area in basement;One  level Home Equipment: Grab bars - tub/shower;Cane - single point  Prior Function Prior Level of Function : Independent/Modified Independent;Driving             Mobility Comments: ambulated without AD ADLs Comments: Ind with ADL's and IADL's.     Extremity/Trunk Assessment   Upper Extremity Assessment Upper Extremity Assessment: Overall WFL for tasks assessed;RUE deficits/detail;LUE deficits/detail RUE Deficits / Details: NWB due to sternal precautions LUE Deficits / Details: NWB due to sternal precautions    Lower Extremity Assessment Lower Extremity Assessment: Overall WFL for tasks assessed    Cervical / Trunk Assessment Cervical / Trunk Assessment: Normal  Communication   Communication Communication: No apparent difficulties Cueing Techniques: Verbal cues  Cognition Arousal: Alert Behavior During Therapy: WFL for tasks assessed/performed Overall Cognitive Status: Within Functional Limits for tasks assessed          General Comments General comments (skin integrity, edema, etc.): Pt HR remained 101 or less O2 sats remained over 90% on room air.Discussed getting sister to set up cat food upstairs due to pt has it down stairs and cannot carry bags.        Assessment/Plan    PT Assessment Patient needs continued PT services  PT Problem List Decreased balance;Decreased activity tolerance;Pain       PT Treatment Interventions DME instruction;Therapeutic activities;Gait training;Therapeutic exercise;Stair training;Balance training;Functional mobility training;Patient/family education    PT Goals (Current goals can be found in the Care Plan section)  Acute Rehab PT Goals Patient Stated Goal: to return home and get back to her baseline PT Goal Formulation: With patient Time For Goal Achievement: 09/29/23 Potential to Achieve Goals: Good    Frequency Min 1X/week        AM-PAC PT "6 Clicks" Mobility  Outcome Measure Help needed turning from your back  to your side while in a flat bed without using bedrails?: A Little Help needed moving from lying on your back to sitting on the side of a flat bed without using bedrails?: A Little Help needed moving to and from a bed to a chair (including a wheelchair)?: A Little Help needed standing up from a chair using your arms (e.g., wheelchair or bedside chair)?: A Little Help needed to walk in hospital room?: A Little Help needed climbing 3-5 steps with a railing? : A Little 6 Click Score: 18    End of Session Equipment Utilized During Treatment: Gait belt Activity Tolerance: Patient limited by fatigue Patient left: in bed;with call bell/phone within reach Nurse Communication: Mobility status PT Visit Diagnosis: Unsteadiness on feet (R26.81);Other abnormalities of gait and mobility (R26.89)    Time: 9604-5409 PT Time Calculation (min) (ACUTE ONLY): 32 min   Charges:   PT Evaluation $PT Eval Low Complexity: 1 Low PT Treatments $Therapeutic Activity: 8-22 mins PT General Charges $$ ACUTE PT VISIT: 1 Visit        Harrel Carina, DPT, CLT  Acute Rehabilitation Services Office: [phone] (Secure chat preferred)   Claudia Desanctis 09/15/2023, 12:19 PM

## 2023-09-15 NOTE — TOC Progression Note (Signed)
Transition of Care [facility]) - Progression Note    Patient Details  Name: Darlene Burns MRN: [id number] Date of Birth: 1951-01-06  Transition of Care [facility]) CM/SW Contact  Elliot Cousin, RN Phone Number: [phone] 09/15/2023, 2:47 PM  Clinical Narrative:     CM spoke to pt and friend at bedside. Gave permission to speak to sister. She will coming to stay a couple of weeks to assist pt at home. Spoke to sister and states she has vacation time she can use.  Contacted [facility] rep, Tresa Endo with new referral. Contacted [facility] rep, Mitch for RW.  Contacted pt's PCP to arrange hospital follow up appt on 1/3 at 1000 am.  Will continue to follow for dc needs.   Expected Discharge Plan: Home w Home Health Services Barriers to Discharge: No Barriers Identified  Expected Discharge Plan and Services   Discharge Planning Services: CM Consult Post Acute Care Choice: Home Health Living arrangements for the past 2 months: Single Family Home                 DME Arranged: Walker rolling DME Agency: [employer]       HH Arranged: PT HH Agency: [facility] Date HH Agency Contacted: 09/14/23 Time HH Agency Contacted: 1318 Representative spoke with at [facility] Agency: Hassel Neth   Social Determinants of Health (SDOH) Interventions SDOH Screenings   Food Insecurity: No Food Insecurity (09/14/2023)  Housing: Unknown (09/14/2023)  Transportation Needs: No Transportation Needs (09/14/2023)  Utilities: Not At Risk (09/14/2023)  Tobacco Use: Low Risk  (09/11/2023)    Readmission Risk Interventions     No data to display

## 2023-09-15 NOTE — Progress Notes (Signed)
Inpatient Rehab Admissions Coordinator:  Consult received. Note, PT is recommending HH therapy for pt. TOC made aware.   Wolfgang Phoenix, MS, CCC-SLP Admissions Coordinator [phone]

## 2023-09-15 NOTE — Progress Notes (Signed)
CARDIAC REHAB PHASE I   PRE:  Rate/Rhythm: 73 NSR  BP:  Sitting: 116/62      SpO2: 94 RA  MODE:  Ambulation: 64 ft    POST:  Rate/Rhythm: 91 NSR  BP:  Sitting: 102/64      SpO2: 91 RA  Pt agreeable to mobilizing in the hallway. Pt able to stand with min assist and verbal reminders to follow SP. Once standing, pt ambulated with standby assistance and intermittent "hands on assist" using RW. Pt had difficulty steering RW through hallway. Poor pleth wave throughout session, pt denies feeling dizzy and SOB. Pt did report feeling tired after session, pt help[ed to bed and educated on restrictions and sternal precautions with friend present. Unfortunately CRP2 won't be covered with Medicare insurance.   Faustino Congress  MS, ACSM-CEP 1:41 PM 09/15/2023    Service time is from 1245 to 1341.

## 2023-09-15 NOTE — Plan of Care (Signed)
  Problem: Education: Goal: Knowledge of General Education information will improve Description: Including pain rating scale, medication(s)/side effects and non-pharmacologic comfort measures Outcome: Progressing   Problem: Health Behavior/Discharge Planning: Goal: Ability to manage health-related needs will improve Outcome: Progressing   Problem: Clinical Measurements: Goal: Ability to maintain clinical measurements within normal limits will improve Outcome: Progressing Goal: Will remain free from infection Outcome: Progressing Goal: Diagnostic test results will improve Outcome: Progressing Goal: Respiratory complications will improve Outcome: Progressing Goal: Cardiovascular complication will be avoided Outcome: Progressing   Problem: Activity: Goal: Risk for activity intolerance will decrease Outcome: Progressing   Problem: Nutrition: Goal: Adequate nutrition will be maintained Outcome: Progressing   Problem: Coping: Goal: Level of anxiety will decrease Outcome: Progressing   Problem: Elimination: Goal: Will not experience complications related to bowel motility Outcome: Progressing Goal: Will not experience complications related to urinary retention Outcome: Progressing   Problem: Pain Management: Goal: General experience of comfort will improve Outcome: Progressing   Problem: Safety: Goal: Ability to remain free from injury will improve Outcome: Progressing   Problem: Skin Integrity: Goal: Risk for impaired skin integrity will decrease Outcome: Progressing   Problem: Education: Goal: Will demonstrate proper wound care and an understanding of methods to prevent future damage Outcome: Progressing Goal: Knowledge of disease or condition will improve Outcome: Progressing Goal: Knowledge of the prescribed therapeutic regimen will improve Outcome: Progressing Goal: Individualized Educational Video(s) Outcome: Progressing   Problem: Activity: Goal: Risk for  activity intolerance will decrease Outcome: Progressing   Problem: Cardiac: Goal: Will achieve and/or maintain hemodynamic stability Outcome: Progressing   Problem: Clinical Measurements: Goal: Postoperative complications will be avoided or minimized Outcome: Progressing   Problem: Respiratory: Goal: Respiratory status will improve Outcome: Progressing   Problem: Skin Integrity: Goal: Wound healing without signs and symptoms of infection Outcome: Progressing Goal: Risk for impaired skin integrity will decrease Outcome: Progressing   Problem: Urinary Elimination: Goal: Ability to achieve and maintain adequate renal perfusion and functioning will improve Outcome: Progressing   Problem: Education: Goal: Ability to describe self-care measures that may prevent or decrease complications (Diabetes Survival Skills Education) will improve Outcome: Progressing Goal: Individualized Educational Video(s) Outcome: Progressing   Problem: Coping: Goal: Ability to adjust to condition or change in health will improve Outcome: Progressing   Problem: Fluid Volume: Goal: Ability to maintain a balanced intake and output will improve Outcome: Progressing   Problem: Health Behavior/Discharge Planning: Goal: Ability to identify and utilize available resources and services will improve Outcome: Progressing Goal: Ability to manage health-related needs will improve Outcome: Progressing   Problem: Metabolic: Goal: Ability to maintain appropriate glucose levels will improve Outcome: Progressing   Problem: Nutritional: Goal: Maintenance of adequate nutrition will improve Outcome: Progressing Goal: Progress toward achieving an optimal weight will improve Outcome: Progressing   Problem: Skin Integrity: Goal: Risk for impaired skin integrity will decrease Outcome: Progressing   Problem: Tissue Perfusion: Goal: Adequacy of tissue perfusion will improve Outcome: Progressing

## 2023-09-15 NOTE — Progress Notes (Signed)
Patient arrived at the unit from [facility], chg bath given,vitals checked, pt oriented to the unit,call bell in reach

## 2023-09-16 LAB — BASIC METABOLIC PANEL
Anion gap: 10 (ref 5–15)
BUN: 20 mg/dL (ref 8–23)
CO2: 31 mmol/L (ref 22–32)
Calcium: 8.5 mg/dL — ABNORMAL LOW (ref 8.9–10.3)
Chloride: 96 mmol/L — ABNORMAL LOW (ref 98–111)
Creatinine, Ser: 0.92 mg/dL (ref 0.44–1.00)
GFR, Estimated: >60 mL/min (ref >60)
Glucose, Bld: 210 mg/dL — ABNORMAL HIGH (ref 70–99)
Potassium: 3.1 mmol/L — ABNORMAL LOW (ref 3.5–5.1)
Sodium: 137 mmol/L (ref 135–145)

## 2023-09-16 LAB — MAGNESIUM: Magnesium: 2 mg/dL (ref 1.7–2.4)

## 2023-09-16 MED ORDER — GUAIFENESIN ER 600 MG PO TB12
600.0000 mg | ORAL_TABLET | Freq: Two times a day (BID) | ORAL | Status: DC
  Administered 2023-09-16 – 2023-09-18 (×5): 600 mg via ORAL
  Filled 2023-09-16 (×5): qty 1

## 2023-09-16 MED ORDER — POTASSIUM CHLORIDE 20 MEQ PO PACK
40.0000 meq | PACK | Freq: Three times a day (TID) | ORAL | Status: DC
  Administered 2023-09-16 (×3): 40 meq via ORAL
  Filled 2023-09-16 (×3): qty 2

## 2023-09-16 MED ORDER — POTASSIUM CHLORIDE 20 MEQ PO PACK
20.0000 meq | PACK | Freq: Two times a day (BID) | ORAL | Status: DC
Start: 2023-09-16 — End: 2023-09-16
  Administered 2023-09-16: 20 meq via ORAL
  Filled 2023-09-16: qty 1

## 2023-09-16 MED ORDER — FUROSEMIDE 40 MG PO TABS
40.0000 mg | ORAL_TABLET | Freq: Two times a day (BID) | ORAL | Status: DC
  Administered 2023-09-16 – 2023-09-17 (×2): 40 mg via ORAL
  Filled 2023-09-16 (×2): qty 1

## 2023-09-16 NOTE — Progress Notes (Addendum)
CARDIAC REHAB PHASE I   PRE:  Rate/Rhythm: 96  BP:    Sitting: 115/74     SaO2: 98% RA  MODE:  Ambulation: 120 ft   POST:  Rate/Rhythem: 97  BP:    Sitting: 117/67     SaO2: 98% RA  0812-0910 Patient ambulated independently with rollator. Stopped times 2 to rest. Steward Drone said that she felt uneasy when walking. Reinforced use of incentive spirometer. Denied feeling lightheaded. Patient assisted back to recliner with call bell within reach. Reviewed heart healthy diet  and exercise instructions with the patient. Patient says she would like to participate in outpatient cardiac rehab if her insurance will provide coverage.   Thayer Headings RN BSN

## 2023-09-16 NOTE — Progress Notes (Signed)
Pt ambulated x 150 feet around the unit with front wheel walker, tolerated fair. She needed one short rest brake.

## 2023-09-16 NOTE — Progress Notes (Signed)
[address]       [place] [address]             [phone]      5 Days Post-Op Procedure(s) (LRB): REPAIR OF ACUTE ASCENDING THORACIC AORTIC DISSECTION USING 30 MM HEMASHIELD PLATINUM (N/A) INTRAOPERATIVE TRANSESOPHAGEAL ECHOCARDIOGRAM (N/A) Subjective: Patient states in the mornings she has congestion she feels she needs to cough up but she cannot cough hard enough to get it up. She also admits to multiple loose stools yesterday.   Objective: Vital signs in last 24 hours: Temp:  [97.7 F (36.5 C)-98.6 F (37 C)] 98.2 F (36.8 C) (12/28 0315) Pulse Rate:  [77-140] 88 (12/28 0315) Cardiac Rhythm: Normal sinus rhythm (12/27 2126) Resp:  [14-26] 14 (12/28 0315) BP: (100-123)/(57-78) 118/74 (12/28 0315) SpO2:  [69 %-98 %] 98 % (12/28 0315) Weight:  [67.2 kg] 67.2 kg (12/28 0315)  Hemodynamic parameters for last 24 hours:    Intake/Output from previous day: 12/27 0701 - 12/28 0700 In: 470 [P.O.:470] Out: 700 [Urine:700] Intake/Output this shift: No intake/output data recorded.  General appearance: alert, cooperative, and no distress Neurologic: intact Heart: regular rate and rhythm, S1, S2 normal, no murmur, click, rub or gallop Lungs: slightly diminished bibasilar breath sounds Abdomen: soft, non-tender; bowel sounds normal; no masses,  no organomegaly Extremities: edema trace-1+ Wound: Clean and dry without sign of infection  Lab Results: Recent Labs    09/14/23 0416 09/15/23 0242  WBC 11.0* 11.0*  HGB 8.1* 9.5*  HCT 23.9* 28.4*  PLT 87* 110*   BMET:  Recent Labs    09/14/23 0416 09/15/23 0242  NA 140 137  K 3.9 3.1*  CL 107 96*  CO2 28 32  GLUCOSE 126* 129*  BUN 21 18  CREATININE 0.86 0.82  CALCIUM 8.0* 8.5*    PT/INR: No results for input(s): "LABPROT", "INR" in the last 72 hours. ABG    Component Value Date/Time   PHART 7.389 09/12/2023 1142   HCO3 23.0 09/12/2023 1142   TCO2 24 09/12/2023 1142   ACIDBASEDEF 2.0  09/12/2023 1142   O2SAT 93 09/12/2023 1142   CBG (last 3)  Recent Labs    09/14/23 2109 09/15/23 0626 09/15/23 1155  GLUCAP 130* 141* 116*    Assessment/Plan: S/P Procedure(s) (LRB): REPAIR OF ACUTE ASCENDING THORACIC AORTIC DISSECTION USING 30 MM HEMASHIELD PLATINUM (N/A) INTRAOPERATIVE TRANSESOPHAGEAL ECHOCARDIOGRAM (N/A)  CV: A couple bursts of irregular rhythm/ventricular ectopy, controlled rate. Does not look like afib to me. NSR, HR 80-90s this AM. Continue lopressor 50mg  BID. SBP 118 this AM. Will check and correct potassium and magnesium if needed.   Pulm: Saturating well on RA. Last CXR with bibasilar atelectasis and small bilateral pleural effusions L>R. Patient with congestion she is having difficulty cough up this AM, will add Mucinex. Encourage IS and ambulation.   GI: Loose stools yesterday, tolerating a diet. Will d/c stool softeners.   Endo: Hypothyroid, on Synthroid. No hx of DM, SSI and CBGs have been d/c'd.  Renal: Cr 0.82, stable. UO 700cc/24hrs recorded. Lasix 40mg . +10lbs over preop weight, same as yesterday with PO Lasix. May need to add Metolazone vs IV Lasix? K 3.1 yesterday, will get updated bmet. Continue K supplement.   ID: Likely reactive leukocytosis, stable. WBC 11.   Expected postop ABLA: H/H 9.5/28.4, improving. Continue iron supplement. Not clinically significant at this time.   Expected thrombocytopenia: plt 110,000, improving. Monitor  DVT Prophylaxis: Lovenox held due to thrombocytopenia  Deconditioning:  HH recommended by PT. Continue work with PT.   Dispo: Continue diuresis and monitor heart rhythm. Hopefully if remains stable can d/c next couple of days.    LOS: 5 days    Jenny Reichmann, PA-C 09/16/2023

## 2023-09-16 NOTE — Evaluation (Signed)
Occupational Therapy Evaluation Patient Details Name: Darlene Burns MRN: [id number] DOB: 11-29-1950 Today's Date: 09/16/2023   History of Present Illness Pt presented to [facility] ED due to chest pain with associated diaphoresis, emesis and shortness of breath. Pt is currently s/p repair of acute ascending thoracic aortic dissection. PMH: fibromyalgia, HLD, arthritis.   Clinical Impression   Pt admitted for above, reviewed sternal precautions to which she recalls very well. Pt ambulated in hall with grossly supervision but intermittent CGA due to her seeming to be fatigued and SOB at times. Educated pt on compensatory strategies and alternative AE to complete ADLs while maintaining precautions, she verbalized understanding of all. Pt would benefit from continued acute skilled OT services to address listed deficits and educate on energy conservation prn for her reduced activity tolerance. Patient would benefit from post acute Home OT services to help maximize functional independence in natural environment        If plan is discharge home, recommend the following: A little help with bathing/dressing/bathroom;Assistance with cooking/housework;Assist for transportation    Functional Status Assessment  Patient has had a recent decline in their functional status and demonstrates the ability to make significant improvements in function in a reasonable and predictable amount of time.  Equipment Recommendations  None recommended by OT (Pt has rec DME)    Recommendations for Other Services       Precautions / Restrictions Precautions Precautions: Sternal Precaution Booklet Issued: No Precaution Comments: pt is able to state all precautions, reports she already has a handout from someone Restrictions Weight Bearing Restrictions Per Provider Order: Yes (sternal precautions)      Mobility Bed Mobility               General bed mobility comments: in recliner    Transfers Overall transfer  level: Needs assistance Equipment used: None Transfers: Sit to/from Stand             General transfer comment: STS x3 with CGA, min cues for hand placement to maintain precautions      Balance Overall balance assessment: Needs assistance Sitting-balance support: Bilateral upper extremity supported Sitting balance-Leahy Scale: Fair Sitting balance - Comments: in recliner   Standing balance support: No upper extremity supported, Bilateral upper extremity supported, Single extremity supported, During functional activity, Reliant on assistive device for balance Standing balance-Leahy Scale: Fair Standing balance comment: stands no aD                           ADL either performed or assessed with clinical judgement   ADL Overall ADL's : Needs assistance/impaired Eating/Feeding: Independent;Sitting   Grooming: Standing;Wash/dry hands;Supervision/safety   Upper Body Bathing: Sitting;Set up   Lower Body Bathing: Sitting/lateral leans;Minimal assistance Lower Body Bathing Details (indicate cue type and reason): discussed with pt the use of a shower seat to assist with LBB, especially due to activity tolerance Upper Body Dressing : Sitting;Set up Upper Body Dressing Details (indicate cue type and reason): Discussed with pt strategies to don shirts and she demonstrated ability to don jacket after instruction Lower Body Dressing: Set up;Sitting/lateral leans Lower Body Dressing Details (indicate cue type and reason): using bed propping technique Toilet Transfer: Contact guard assist;Rolling walker (2 wheels);Ambulation   Toileting- Clothing Manipulation and Hygiene: Moderate assistance Toileting - Clothing Manipulation Details (indicate cue type and reason): increased challenge for pt, discussed wiping from the side with lateral leans or AE such as handheld bidets and bottom buddys  Functional mobility during ADLs: Supervision/safety;Rolling walker (2 wheels) General  ADL Comments: Discussed home modifications to reduce overhead reaching by repositioning the shelves/items. Also went over stratgies to continue cooking, emphasizing lighter meals due to lifting precautions     Vision         Perception         Praxis         Pertinent Vitals/Pain Pain Assessment Pain Assessment: Faces Faces Pain Scale: Hurts little more Pain Location: sternum, incision site Pain Descriptors / Indicators: Aching, Discomfort Pain Intervention(s): Limited activity within patient's tolerance, Monitored during session, Repositioned, Utilized relaxation techniques     Extremity/Trunk Assessment Upper Extremity Assessment Upper Extremity Assessment: Overall WFL for tasks assessed RUE Deficits / Details: NWB due to sternal precautions LUE Deficits / Details: NWB due to sternal precautions   Lower Extremity Assessment Lower Extremity Assessment: Overall WFL for tasks assessed   Cervical / Trunk Assessment Cervical / Trunk Assessment: Normal   Communication Communication Communication: No apparent difficulties Cueing Techniques: Verbal cues   Cognition Arousal: Alert Behavior During Therapy: WFL for tasks assessed/performed Overall Cognitive Status: Within Functional Limits for tasks assessed                                       General Comments  VSS, HR in low 90s    Exercises     Shoulder Instructions      Home Living Family/patient expects to be discharged to:: Private residence Living Arrangements: Alone Available Help at Discharge: Family;Available 24 hours/day;Available PRN/intermittently;Neighbor (sister is coming to help for about week after pt leaves and neighbor who can come by occasionally) Type of Home: House Home Access: Stairs to enter [employer] of Steps: 3 Entrance Stairs-Rails: Left Home Layout: Laundry or work area in basement;One level     Bathroom Shower/Tub: Chief Strategy Officer:  Standard     Home Equipment: Grab bars - tub/shower;Cane - single point          Prior Functioning/Environment Prior Level of Function : Independent/Modified Independent;Driving             Mobility Comments: ambulated without AD ADLs Comments: Ind with ADL's and IADL's.        OT Problem List: Impaired balance (sitting and/or standing);Decreased strength;Pain;Decreased activity tolerance;Decreased knowledge of precautions;Impaired UE functional use      OT Treatment/Interventions: Self-care/ADL training;Therapeutic exercise;Patient/family education;Therapeutic activities;DME and/or AE instruction;Balance training    OT Goals(Current goals can be found in the care plan section) Acute Rehab OT Goals Patient Stated Goal: To go home OT Goal Formulation: With patient Time For Goal Achievement: 09/30/23 Potential to Achieve Goals: Good ADL Goals Pt Will Perform Grooming: with modified independence;standing Pt Will Perform Upper Body Bathing: with modified independence;standing;sitting Pt Will Perform Upper Body Dressing: with modified independence;standing Pt Will Perform Toileting - Clothing Manipulation and hygiene: with modified independence;with adaptive equipment;sitting/lateral leans  OT Frequency: Min 1X/week    Co-evaluation              AM-PAC OT "6 Clicks" Daily Activity     Outcome Measure Help from another person eating meals?: None Help from another person taking care of personal grooming?: A Little Help from another person toileting, which includes using toliet, bedpan, or urinal?: A Lot Help from another person bathing (including washing, rinsing, drying)?: A Little Help from another person to put on and taking  off regular upper body clothing?: A Little Help from another person to put on and taking off regular lower body clothing?: A Little 6 Click Score: 18   End of Session Equipment Utilized During Treatment: Gait belt;Rolling walker (2  wheels) Nurse Communication: Mobility status  Activity Tolerance: Patient tolerated treatment well Patient left: with call bell/phone within reach;in bed;with nursing/sitter in room;with family/visitor present  OT Visit Diagnosis: Unsteadiness on feet (R26.81);Pain Pain - Right/Left:  (sternum)                Time: 4696-2952 OT Time Calculation (min): 23 min Charges:  OT General Charges $OT Visit: 1 Visit OT Evaluation $OT Eval Moderate Complexity: 1 Mod OT Treatments $Self Care/Home Management : 8-22 mins  09/16/2023  AB, OTR/L  Acute Rehabilitation Services  Office: [phone]   Tristan Schroeder 09/16/2023, 1:31 PM

## 2023-09-17 ENCOUNTER — Inpatient Hospital Stay (HOSPITAL_COMMUNITY): Payer: Medicare PPO

## 2023-09-17 LAB — BASIC METABOLIC PANEL
Anion gap: 7 (ref 5–15)
BUN: 21 mg/dL (ref 8–23)
CO2: 30 mmol/L (ref 22–32)
Calcium: 8.6 mg/dL — ABNORMAL LOW (ref 8.9–10.3)
Chloride: 102 mmol/L (ref 98–111)
Creatinine, Ser: 1.01 mg/dL — ABNORMAL HIGH (ref 0.44–1.00)
GFR, Estimated: 59 mL/min — ABNORMAL LOW (ref >60)
Glucose, Bld: 136 mg/dL — ABNORMAL HIGH (ref 70–99)
Potassium: 4.4 mmol/L (ref 3.5–5.1)
Sodium: 139 mmol/L (ref 135–145)

## 2023-09-17 MED ORDER — POTASSIUM CHLORIDE 20 MEQ PO PACK
20.0000 meq | PACK | Freq: Two times a day (BID) | ORAL | Status: DC
  Administered 2023-09-17 – 2023-09-18 (×3): 20 meq via ORAL
  Filled 2023-09-17 (×3): qty 1

## 2023-09-17 MED ORDER — FUROSEMIDE 10 MG/ML IJ SOLN
40.0000 mg | Freq: Once | INTRAMUSCULAR | Status: AC
  Administered 2023-09-17: 40 mg via INTRAVENOUS
  Filled 2023-09-17: qty 4

## 2023-09-17 NOTE — Progress Notes (Signed)
[address]       [place] [address]             [phone]      6 Days Post-Op Procedure(s) (LRB): REPAIR OF ACUTE ASCENDING THORACIC AORTIC DISSECTION USING 30 MM HEMASHIELD PLATINUM (N/A) INTRAOPERATIVE TRANSESOPHAGEAL ECHOCARDIOGRAM (N/A) Subjective: Patient admits to sternal soreness this AM especially when coughing. She feels the mucinex helped a little with her congestion but she continues to have a dry cough.   Objective: Vital signs in last 24 hours: Temp:  [98.1 F (36.7 C)-98.4 F (36.9 C)] 98.1 F (36.7 C) (12/29 0334) Pulse Rate:  [81-94] 86 (12/29 0334) Cardiac Rhythm: Normal sinus rhythm (12/28 1944) Resp:  [17-20] 19 (12/29 0334) BP: (106-132)/(55-71) 132/71 (12/29 0334) SpO2:  [95 %-100 %] 96 % (12/29 0334) Weight:  [67 kg] 67 kg (12/29 0526)  Hemodynamic parameters for last 24 hours:    Intake/Output from previous day: 12/28 0701 - 12/29 0700 In: 120 [P.O.:120] Out: -  Intake/Output this shift: No intake/output data recorded.  General appearance: alert, cooperative, and no distress Neurologic: intact Heart: regular rate and rhythm, S1, S2 normal, no murmur, click, rub or gallop Lungs: slightly diminished left basilar breath sounds Abdomen: soft, non-tender; bowel sounds normal; no masses,  no organomegaly Extremities: edema trace Wound: Clean and dry without sign of infection  Lab Results: Recent Labs    09/15/23 0242  WBC 11.0*  HGB 9.5*  HCT 28.4*  PLT 110*   BMET:  Recent Labs    09/16/23 0803 09/17/23 0229  NA 137 139  K 3.1* 4.4  CL 96* 102  CO2 31 30  GLUCOSE 210* 136*  BUN 20 21  CREATININE 0.92 1.01*  CALCIUM 8.5* 8.6*    PT/INR: No results for input(s): "LABPROT", "INR" in the last 72 hours. ABG    Component Value Date/Time   PHART 7.389 09/12/2023 1142   HCO3 23.0 09/12/2023 1142   TCO2 24 09/12/2023 1142   ACIDBASEDEF 2.0 09/12/2023 1142   O2SAT 93 09/12/2023 1142   CBG (last 3)   Recent Labs    09/14/23 2109 09/15/23 0626 09/15/23 1155  GLUCAP 130* 141* 116*    Assessment/Plan: S/P Procedure(s) (LRB): REPAIR OF ACUTE ASCENDING THORACIC AORTIC DISSECTION USING 30 MM HEMASHIELD PLATINUM (N/A) INTRAOPERATIVE TRANSESOPHAGEAL ECHOCARDIOGRAM (N/A)  CV: NSR, HR 80-90s this AM with few PVCs. Continue lopressor 50mg  BID. BP controlled, SBP 132 this AM, mainly 106-117.    Pulm: Saturating well on RA. CXR with a small left pleural effusion and bibasilar atelectasis. On Mucinex for congestion. Continues to have a dry cough. Encourage IS and ambulation.    GI: Loose stools resolved. Tolerating a diet    Endo: Hypothyroid, on Synthroid. No hx of DM, SSI and CBGs have been d/c'd.   Renal: Cr 1.01 from 0.92 yesterday, overall stable. No UO recorded. +9lbs from preop weight. Lasix 40mg  BID as recommended by Dr. Leafy Ro. K 4.4 after aggressive supplementation.    ID: Likely reactive leukocytosis, stable. WBC 11.    Expected postop ABLA: H/H 9.5/28.4, improving. Continue iron supplement. Not clinically significant at this time.    Expected thrombocytopenia: plt 110,000, improving. Monitor   DVT Prophylaxis: Lovenox held due to thrombocytopenia   Deconditioning: HH recommended by PT/OT. Continue work with PT/OT. Patient would like to practice stairs prior to discharge since she has a few concrete steps to get into her house.   Dispo: Would benefit from further diuresis.  Possible D/C home today vs tomorrow? Will discuss dispo plan with surgeon.      LOS: 6 days    Jenny Reichmann, PA-C 09/17/2023

## 2023-09-17 NOTE — Plan of Care (Signed)
  Problem: Education: Goal: Knowledge of General Education information will improve Description: Including pain rating scale, medication(s)/side effects and non-pharmacologic comfort measures Outcome: Progressing   Problem: Health Behavior/Discharge Planning: Goal: Ability to manage health-related needs will improve Outcome: Progressing   Problem: Clinical Measurements: Goal: Ability to maintain clinical measurements within normal limits will improve Outcome: Progressing Goal: Will remain free from infection Outcome: Progressing Goal: Diagnostic test results will improve Outcome: Progressing Goal: Respiratory complications will improve Outcome: Progressing Goal: Cardiovascular complication will be avoided Outcome: Progressing   Problem: Activity: Goal: Risk for activity intolerance will decrease Outcome: Progressing   Problem: Nutrition: Goal: Adequate nutrition will be maintained Outcome: Progressing   Problem: Coping: Goal: Level of anxiety will decrease Outcome: Progressing   Problem: Elimination: Goal: Will not experience complications related to bowel motility Outcome: Progressing Goal: Will not experience complications related to urinary retention Outcome: Progressing   Problem: Pain Management: Goal: General experience of comfort will improve Outcome: Progressing   Problem: Safety: Goal: Ability to remain free from injury will improve Outcome: Progressing   Problem: Skin Integrity: Goal: Risk for impaired skin integrity will decrease Outcome: Progressing   Problem: Education: Goal: Will demonstrate proper wound care and an understanding of methods to prevent future damage Outcome: Progressing Goal: Knowledge of disease or condition will improve Outcome: Progressing Goal: Knowledge of the prescribed therapeutic regimen will improve Outcome: Progressing Goal: Individualized Educational Video(s) Outcome: Progressing   Problem: Activity: Goal: Risk for  activity intolerance will decrease Outcome: Progressing   Problem: Cardiac: Goal: Will achieve and/or maintain hemodynamic stability Outcome: Progressing   Problem: Clinical Measurements: Goal: Postoperative complications will be avoided or minimized Outcome: Progressing   Problem: Respiratory: Goal: Respiratory status will improve Outcome: Progressing   Problem: Skin Integrity: Goal: Wound healing without signs and symptoms of infection Outcome: Progressing Goal: Risk for impaired skin integrity will decrease Outcome: Progressing   Problem: Urinary Elimination: Goal: Ability to achieve and maintain adequate renal perfusion and functioning will improve Outcome: Progressing   Problem: Education: Goal: Ability to describe self-care measures that may prevent or decrease complications (Diabetes Survival Skills Education) will improve Outcome: Progressing Goal: Individualized Educational Video(s) Outcome: Progressing   Problem: Coping: Goal: Ability to adjust to condition or change in health will improve Outcome: Progressing   Problem: Fluid Volume: Goal: Ability to maintain a balanced intake and output will improve Outcome: Progressing   Problem: Health Behavior/Discharge Planning: Goal: Ability to identify and utilize available resources and services will improve Outcome: Progressing Goal: Ability to manage health-related needs will improve Outcome: Progressing   Problem: Metabolic: Goal: Ability to maintain appropriate glucose levels will improve Outcome: Progressing   Problem: Nutritional: Goal: Maintenance of adequate nutrition will improve Outcome: Progressing Goal: Progress toward achieving an optimal weight will improve Outcome: Progressing   Problem: Skin Integrity: Goal: Risk for impaired skin integrity will decrease Outcome: Progressing   Problem: Tissue Perfusion: Goal: Adequacy of tissue perfusion will improve Outcome: Progressing

## 2023-09-17 NOTE — Plan of Care (Signed)
  Problem: Education: Goal: Knowledge of General Education information will improve Description: Including pain rating scale, medication(s)/side effects and non-pharmacologic comfort measures Outcome: Progressing   Problem: Health Behavior/Discharge Planning: Goal: Ability to manage health-related needs will improve Outcome: Progressing   Problem: Clinical Measurements: Goal: Ability to maintain clinical measurements within normal limits will improve Outcome: Progressing   Problem: Clinical Measurements: Goal: Will remain free from infection Outcome: Progressing   Problem: Clinical Measurements: Goal: Diagnostic test results will improve Outcome: Progressing   Problem: Clinical Measurements: Goal: Respiratory complications will improve Outcome: Progressing   Problem: Nutrition: Goal: Adequate nutrition will be maintained Outcome: Progressing

## 2023-09-18 LAB — BASIC METABOLIC PANEL
Anion gap: 13 (ref 5–15)
BUN: 15 mg/dL (ref 8–23)
CO2: 26 mmol/L (ref 22–32)
Calcium: 8.5 mg/dL — ABNORMAL LOW (ref 8.9–10.3)
Chloride: 101 mmol/L (ref 98–111)
Creatinine, Ser: 0.86 mg/dL (ref 0.44–1.00)
GFR, Estimated: >60 mL/min (ref >60)
Glucose, Bld: 127 mg/dL — ABNORMAL HIGH (ref 70–99)
Potassium: 3.9 mmol/L (ref 3.5–5.1)
Sodium: 140 mmol/L (ref 135–145)

## 2023-09-18 MED ORDER — ACETAMINOPHEN 325 MG PO TABS: 650.0000 mg | ORAL_TABLET | Freq: Four times a day (QID) | ORAL | Status: AC | PRN

## 2023-09-18 MED ORDER — OXYCODONE HCL 5 MG PO TABS: 5.0000 mg | ORAL_TABLET | Freq: Four times a day (QID) | ORAL | 0 refills | Status: AC | PRN

## 2023-09-18 MED ORDER — METOPROLOL TARTRATE 50 MG PO TABS: 50.0000 mg | ORAL_TABLET | Freq: Two times a day (BID) | ORAL | 1 refills | Status: DC

## 2023-09-18 MED ORDER — ASPIRIN 81 MG PO CHEW: 81.0000 mg | CHEWABLE_TABLET | Freq: Every day | ORAL | Status: AC

## 2023-09-18 MED FILL — Albumin, Human Inj 5%: INTRAVENOUS | Qty: 250 | Status: AC

## 2023-09-18 MED FILL — Lidocaine HCl Local Preservative Free (PF) Inj 2%: INTRAMUSCULAR | Qty: 14 | Status: AC

## 2023-09-18 MED FILL — Mannitol IV Soln 20%: INTRAVENOUS | Qty: 500 | Status: AC

## 2023-09-18 MED FILL — Electrolyte-R (PH 7.4) Solution: INTRAVENOUS | Qty: 3000 | Status: AC

## 2023-09-18 MED FILL — Heparin Sodium (Porcine) Inj 1000 Unit/ML: INTRAMUSCULAR | Qty: 10 | Status: AC

## 2023-09-18 MED FILL — Sodium Chloride IV Soln 0.9%: INTRAVENOUS | Qty: 1000 | Status: AC

## 2023-09-18 MED FILL — Sodium Bicarbonate IV Soln 8.4%: INTRAVENOUS | Qty: 100 | Status: AC

## 2023-09-18 MED FILL — Heparin Sodium (Porcine) Inj 1000 Unit/ML: Qty: 1000 | Status: AC

## 2023-09-18 MED FILL — Calcium Chloride Inj 10%: INTRAVENOUS | Qty: 10 | Status: AC

## 2023-09-18 MED FILL — Potassium Chloride Inj 2 mEq/ML: INTRAVENOUS | Qty: 40 | Status: AC

## 2023-09-18 NOTE — TOC Transition Note (Signed)
Transition of Care [facility]) - Discharge Note Donn Pierini RN, BSN Transitions of Care Unit 4E- RN Case Manager See Treatment Team for direct phone #   Patient Details  Name: Darlene Burns MRN: [id number] Date of Birth: [birth year]  Transition of Care [facility]) CM/SW Contact:  Darrold Span, RN Phone Number: 09/18/2023, 10:14 AM   Clinical Narrative:    Pt stable for transition home today, HH has been set up with [facility] per previous CM note and orders placed for HHPT/OT. [facility] liaison notified for start of care.   DME- RW arranged w/ [facility] and confirmed delivered to room.   Family to transport home- no further TOC needs noted.    Final next level of care: Home w Home Health Services Barriers to Discharge: No Barriers Identified   Patient Goals and CMS Choice Patient states their goals for this hospitalization and ongoing recovery are:: wants to recover CMS Medicare.gov Compare Post Acute Care list provided to:: Patient Choice offered to / list presented to : Patient      Discharge Placement               Home w/ [facility]        Discharge Plan and Services Additional resources added to the After Visit Summary for     Discharge Planning Services: CM Consult Post Acute Care Choice: Home Health          DME Arranged: Walker rolling DME Agency: [facility]       HH Arranged: PT HH Agency: [facility] Date [facility] Agency Contacted: 09/14/23 Time HH Agency Contacted: 1318 Representative spoke with at [facility] Agency: Hassel Neth  Social Drivers of Health (SDOH) Interventions SDOH Screenings   Food Insecurity: No Food Insecurity (09/14/2023)  Housing: Unknown (09/14/2023)  Transportation Needs: No Transportation Needs (09/14/2023)  Utilities: Not At Risk (09/14/2023)  Tobacco Use: Low Risk  (09/11/2023)     Readmission Risk Interventions    09/18/2023   10:14 AM  Readmission Risk Prevention Plan  Post Dischage Appt Complete  Medication  Screening Complete  Transportation Screening Complete

## 2023-09-18 NOTE — Progress Notes (Signed)
 Explained discharge instructions to patient. Reviewed follow up appointment and next medication administration times. Also reviewed education. Patient verbalized having an understanding for instructions given. All belongings are in the patient's possession. IV and telemetry were removed. CCMD was notified. No other needs verbalized. Will transport downstairs for discharge.

## 2023-09-18 NOTE — Progress Notes (Addendum)
7 Days Post-Op Procedure(s) (LRB): REPAIR OF ACUTE ASCENDING THORACIC AORTIC DISSECTION USING 30 MM HEMASHIELD PLATINUM (N/A) INTRAOPERATIVE TRANSESOPHAGEAL ECHOCARDIOGRAM (N/A) Subjective: Conts to feel stronger  Objective: Vital signs in last 24 hours: Temp:  [97.6 F (36.4 C)-99.1 F (37.3 C)] 97.6 F (36.4 C) (12/30 0737) Pulse Rate:  [84-100] 99 (12/30 0737) Cardiac Rhythm: Sinus tachycardia (12/29 1900) Resp:  [16-19] 16 (12/30 0737) BP: (116-145)/(58-81) 127/81 (12/30 0737) SpO2:  [90 %-97 %] 90 % (12/30 0737) Weight:  [65.5 kg] 65.5 kg (12/30 0557)  Hemodynamic parameters for last 24 hours:    Intake/Output from previous day: 12/29 0701 - 12/30 0700 In: 963 [P.O.:960; I.V.:3] Out: -  Intake/Output this shift: No intake/output data recorded.  General appearance: alert, cooperative, and no distress Heart: regular rate and rhythm Lungs: clear to auscultation bilaterally Abdomen: benign Extremities: no edema Wound: incis healing well  Lab Results: No results for input(s): "WBC", "HGB", "HCT", "PLT" in the last 72 hours. BMET:  Recent Labs    09/17/23 0229 09/18/23 0255  NA 139 140  K 4.4 3.9  CL 102 101  CO2 30 26  GLUCOSE 136* 127*  BUN 21 15  CREATININE 1.01* 0.86  CALCIUM 8.6* 8.5*    PT/INR: No results for input(s): "LABPROT", "INR" in the last 72 hours. ABG    Component Value Date/Time   PHART 7.389 09/12/2023 1142   HCO3 23.0 09/12/2023 1142   TCO2 24 09/12/2023 1142   ACIDBASEDEF 2.0 09/12/2023 1142   O2SAT 93 09/12/2023 1142   CBG (last 3)  Recent Labs    09/15/23 1155  GLUCAP 116*    Meds Scheduled Meds:  aspirin  81 mg Oral Daily   Fe Fum-Vit C-Vit B12-FA  1 capsule Oral QPC breakfast   guaiFENesin  600 mg Oral BID   levothyroxine  50 mcg Oral QAC breakfast   metoprolol tartrate  50 mg Oral BID   pantoprazole  40 mg Oral Daily   potassium chloride  20 mEq Oral BID   simvastatin  40 mg Oral q1800   sodium chloride flush  3 mL  Intravenous Q12H   Continuous Infusions: PRN Meds:.ondansetron (ZOFRAN) IV, mouth rinse, oxyCODONE, sodium chloride flush, traMADol  Xrays DG Chest 2 View Result Date: 09/17/2023 CLINICAL DATA:  [id number] status post aortic dissection repair. EXAM: CHEST - 2 VIEW COMPARISON:  Portable chest 09/14/2023 FINDINGS: PA Lat at 5:20 a.m. Small left-greater-than-right layering pleural effusions are again noted unchanged. Intact sternotomy sutures. There is basilar atelectasis but no focal consolidation is seen. The heart silhouette remaining enlarged but no vascular congestion is seen. There is aortic tortuosity and atherosclerosis with stable mediastinal configuration. No new osseous findings. Thoracic kyphosis. Left axillary surgical clips. IMPRESSION: 1. Small left-greater-than-right layering pleural effusions with basilar atelectasis. No focal consolidation. 2. Cardiomegaly without vascular congestion. 3. Aortic atherosclerosis and tortuosity. Electronically Signed   By: Almira Bar M.D.   On: 09/17/2023 07:57    Assessment/Plan: S/P Procedure(s) (LRB): REPAIR OF ACUTE ASCENDING THORACIC AORTIC DISSECTION USING 30 MM HEMASHIELD PLATINUM (N/A) INTRAOPERATIVE TRANSESOPHAGEAL ECHOCARDIOGRAM (N/A)  POD#7 1 afeb, VSS, S BP 110's-140's, sinus rhythm/tachy(low 100's) 2 O2 sats good on RA 3 voiding well, not measured, weight below preop, normal renal fxn 4 on Iron for Expexted ABLA 5 appears stable for d/c. Will be staying with sister for a little while   LOS: 7 days    Rowe Clack PA-C Pager [phone] 09/18/2023   Chart reviewed, patient examined, agree with  above.  She is doing very well. I think she can go home today.

## 2023-09-18 NOTE — Plan of Care (Signed)
Problem: Education: Goal: Knowledge of General Education information will improve Description: Including pain rating scale, medication(s)/side effects and non-pharmacologic comfort measures Outcome: Adequate for Discharge   Problem: Health Behavior/Discharge Planning: Goal: Ability to manage health-related needs will improve Outcome: Adequate for Discharge   Problem: Clinical Measurements: Goal: Ability to maintain clinical measurements within normal limits will improve Outcome: Adequate for Discharge Goal: Will remain free from infection Outcome: Adequate for Discharge Goal: Diagnostic test results will improve Outcome: Adequate for Discharge Goal: Respiratory complications will improve Outcome: Adequate for Discharge Goal: Cardiovascular complication will be avoided Outcome: Adequate for Discharge   Problem: Activity: Goal: Risk for activity intolerance will decrease Outcome: Adequate for Discharge   Problem: Nutrition: Goal: Adequate nutrition will be maintained Outcome: Adequate for Discharge   Problem: Coping: Goal: Level of anxiety will decrease Outcome: Adequate for Discharge   Problem: Elimination: Goal: Will not experience complications related to bowel motility Outcome: Adequate for Discharge Goal: Will not experience complications related to urinary retention Outcome: Adequate for Discharge   Problem: Pain Management: Goal: General experience of comfort will improve Outcome: Adequate for Discharge   Problem: Safety: Goal: Ability to remain free from injury will improve Outcome: Adequate for Discharge   Problem: Skin Integrity: Goal: Risk for impaired skin integrity will decrease Outcome: Adequate for Discharge   Problem: Education: Goal: Will demonstrate proper wound care and an understanding of methods to prevent future damage Outcome: Adequate for Discharge Goal: Knowledge of disease or condition will improve Outcome: Adequate for Discharge Goal:  Knowledge of the prescribed therapeutic regimen will improve Outcome: Adequate for Discharge Goal: Individualized Educational Video(s) Outcome: Adequate for Discharge   Problem: Activity: Goal: Risk for activity intolerance will decrease Outcome: Adequate for Discharge   Problem: Cardiac: Goal: Will achieve and/or maintain hemodynamic stability Outcome: Adequate for Discharge   Problem: Clinical Measurements: Goal: Postoperative complications will be avoided or minimized Outcome: Adequate for Discharge   Problem: Respiratory: Goal: Respiratory status will improve Outcome: Adequate for Discharge   Problem: Skin Integrity: Goal: Wound healing without signs and symptoms of infection Outcome: Adequate for Discharge Goal: Risk for impaired skin integrity will decrease Outcome: Adequate for Discharge   Problem: Urinary Elimination: Goal: Ability to achieve and maintain adequate renal perfusion and functioning will improve Outcome: Adequate for Discharge   Problem: Education: Goal: Ability to describe self-care measures that may prevent or decrease complications (Diabetes Survival Skills Education) will improve Outcome: Adequate for Discharge Goal: Individualized Educational Video(s) Outcome: Adequate for Discharge   Problem: Coping: Goal: Ability to adjust to condition or change in health will improve Outcome: Adequate for Discharge   Problem: Fluid Volume: Goal: Ability to maintain a balanced intake and output will improve Outcome: Adequate for Discharge   Problem: Health Behavior/Discharge Planning: Goal: Ability to identify and utilize available resources and services will improve Outcome: Adequate for Discharge Goal: Ability to manage health-related needs will improve Outcome: Adequate for Discharge   Problem: Metabolic: Goal: Ability to maintain appropriate glucose levels will improve Outcome: Adequate for Discharge   Problem: Nutritional: Goal: Maintenance of  adequate nutrition will improve Outcome: Adequate for Discharge Goal: Progress toward achieving an optimal weight will improve Outcome: Adequate for Discharge   Problem: Skin Integrity: Goal: Risk for impaired skin integrity will decrease Outcome: Adequate for Discharge   Problem: Tissue Perfusion: Goal: Adequacy of tissue perfusion will improve Outcome: Adequate for Discharge   Problem: Acute Rehab PT Goals(only PT should resolve) Goal: Pt Will Go Supine/Side To Sit Outcome: Adequate  for Discharge Goal: Pt Will Go Sit To Supine/Side Outcome: Adequate for Discharge Goal: Patient Will Transfer Sit To/From Stand Outcome: Adequate for Discharge Goal: Pt Will Transfer Bed To Chair/Chair To Bed Outcome: Adequate for Discharge Goal: Pt Will Ambulate Outcome: Adequate for Discharge Goal: Pt Will Go Up/Down Stairs Outcome: Adequate for Discharge   Problem: Acute Rehab OT Goals (only OT should resolve) Goal: Pt. Will Perform Grooming Outcome: Adequate for Discharge Goal: Pt. Will Perform Upper Body Bathing Outcome: Adequate for Discharge Goal: Pt. Will Perform Upper Body Dressing Outcome: Adequate for Discharge Goal: Pt. Will Perform Toileting-Clothing Manipulation Outcome: Adequate for Discharge   Problem: Acute Rehab OT Goals (only OT should resolve) Goal: OT Additional ADL Goal #2 Outcome: Adequate for Discharge

## 2023-09-18 NOTE — Care Management Important Message (Signed)
Important Message  Patient Details  Name: Darlene Burns MRN: [id number] Date of Birth: June 09, 1951   Important Message Given:  Yes - Medicare IM     Renie Ora 09/18/2023, 8:54 AM

## 2023-09-21 DIAGNOSIS — Z4801 Encounter for change or removal of surgical wound dressing: Secondary | ICD-10-CM

## 2023-09-22 DIAGNOSIS — E039 Hypothyroidism, unspecified: Secondary | ICD-10-CM | POA: Diagnosis not present

## 2023-09-22 DIAGNOSIS — R058 Other specified cough: Secondary | ICD-10-CM | POA: Diagnosis not present

## 2023-09-22 DIAGNOSIS — E7800 Pure hypercholesterolemia, unspecified: Secondary | ICD-10-CM | POA: Diagnosis not present

## 2023-09-22 DIAGNOSIS — I71011 Dissection of aortic arch: Secondary | ICD-10-CM | POA: Diagnosis not present

## 2023-09-22 DIAGNOSIS — Z09 Encounter for follow-up examination after completed treatment for conditions other than malignant neoplasm: Secondary | ICD-10-CM | POA: Diagnosis not present

## 2023-09-24 DIAGNOSIS — G4709 Other insomnia: Secondary | ICD-10-CM | POA: Diagnosis not present

## 2023-09-24 DIAGNOSIS — G249 Dystonia, unspecified: Secondary | ICD-10-CM | POA: Diagnosis not present

## 2023-09-24 DIAGNOSIS — M797 Fibromyalgia: Secondary | ICD-10-CM | POA: Diagnosis not present

## 2023-09-24 DIAGNOSIS — I2510 Atherosclerotic heart disease of native coronary artery without angina pectoris: Secondary | ICD-10-CM | POA: Diagnosis not present

## 2023-09-24 DIAGNOSIS — N189 Chronic kidney disease, unspecified: Secondary | ICD-10-CM | POA: Diagnosis not present

## 2023-09-24 DIAGNOSIS — Z48812 Encounter for surgical aftercare following surgery on the circulatory system: Secondary | ICD-10-CM | POA: Diagnosis not present

## 2023-09-24 DIAGNOSIS — M5030 Other cervical disc degeneration, unspecified cervical region: Secondary | ICD-10-CM | POA: Diagnosis not present

## 2023-09-24 DIAGNOSIS — M5136 Other intervertebral disc degeneration, lumbar region with discogenic back pain only: Secondary | ICD-10-CM | POA: Diagnosis not present

## 2023-09-24 DIAGNOSIS — K5790 Diverticulosis of intestine, part unspecified, without perforation or abscess without bleeding: Secondary | ICD-10-CM | POA: Diagnosis not present

## 2023-09-27 DIAGNOSIS — M5136 Other intervertebral disc degeneration, lumbar region with discogenic back pain only: Secondary | ICD-10-CM | POA: Diagnosis not present

## 2023-09-27 DIAGNOSIS — G4709 Other insomnia: Secondary | ICD-10-CM | POA: Diagnosis not present

## 2023-09-27 DIAGNOSIS — G249 Dystonia, unspecified: Secondary | ICD-10-CM | POA: Diagnosis not present

## 2023-09-27 DIAGNOSIS — N189 Chronic kidney disease, unspecified: Secondary | ICD-10-CM | POA: Diagnosis not present

## 2023-09-27 DIAGNOSIS — M5030 Other cervical disc degeneration, unspecified cervical region: Secondary | ICD-10-CM | POA: Diagnosis not present

## 2023-09-27 DIAGNOSIS — Z48812 Encounter for surgical aftercare following surgery on the circulatory system: Secondary | ICD-10-CM | POA: Diagnosis not present

## 2023-09-27 DIAGNOSIS — M797 Fibromyalgia: Secondary | ICD-10-CM | POA: Diagnosis not present

## 2023-09-27 DIAGNOSIS — I2510 Atherosclerotic heart disease of native coronary artery without angina pectoris: Secondary | ICD-10-CM | POA: Diagnosis not present

## 2023-09-27 DIAGNOSIS — K5790 Diverticulosis of intestine, part unspecified, without perforation or abscess without bleeding: Secondary | ICD-10-CM | POA: Diagnosis not present

## 2023-10-02 DIAGNOSIS — G249 Dystonia, unspecified: Secondary | ICD-10-CM | POA: Diagnosis not present

## 2023-10-02 DIAGNOSIS — G4709 Other insomnia: Secondary | ICD-10-CM | POA: Diagnosis not present

## 2023-10-02 DIAGNOSIS — Z48812 Encounter for surgical aftercare following surgery on the circulatory system: Secondary | ICD-10-CM | POA: Diagnosis not present

## 2023-10-02 DIAGNOSIS — M5136 Other intervertebral disc degeneration, lumbar region with discogenic back pain only: Secondary | ICD-10-CM | POA: Diagnosis not present

## 2023-10-02 DIAGNOSIS — M797 Fibromyalgia: Secondary | ICD-10-CM | POA: Diagnosis not present

## 2023-10-02 DIAGNOSIS — I2510 Atherosclerotic heart disease of native coronary artery without angina pectoris: Secondary | ICD-10-CM | POA: Diagnosis not present

## 2023-10-02 DIAGNOSIS — N189 Chronic kidney disease, unspecified: Secondary | ICD-10-CM | POA: Diagnosis not present

## 2023-10-02 DIAGNOSIS — K5790 Diverticulosis of intestine, part unspecified, without perforation or abscess without bleeding: Secondary | ICD-10-CM | POA: Diagnosis not present

## 2023-10-02 DIAGNOSIS — M5030 Other cervical disc degeneration, unspecified cervical region: Secondary | ICD-10-CM | POA: Diagnosis not present

## 2023-10-04 DIAGNOSIS — Z48812 Encounter for surgical aftercare following surgery on the circulatory system: Secondary | ICD-10-CM | POA: Diagnosis not present

## 2023-10-04 DIAGNOSIS — G4709 Other insomnia: Secondary | ICD-10-CM | POA: Diagnosis not present

## 2023-10-04 DIAGNOSIS — G249 Dystonia, unspecified: Secondary | ICD-10-CM | POA: Diagnosis not present

## 2023-10-04 DIAGNOSIS — M5030 Other cervical disc degeneration, unspecified cervical region: Secondary | ICD-10-CM | POA: Diagnosis not present

## 2023-10-04 DIAGNOSIS — I2510 Atherosclerotic heart disease of native coronary artery without angina pectoris: Secondary | ICD-10-CM | POA: Diagnosis not present

## 2023-10-04 DIAGNOSIS — M797 Fibromyalgia: Secondary | ICD-10-CM | POA: Diagnosis not present

## 2023-10-04 DIAGNOSIS — N189 Chronic kidney disease, unspecified: Secondary | ICD-10-CM | POA: Diagnosis not present

## 2023-10-04 DIAGNOSIS — K5790 Diverticulosis of intestine, part unspecified, without perforation or abscess without bleeding: Secondary | ICD-10-CM | POA: Diagnosis not present

## 2023-10-04 DIAGNOSIS — M5136 Other intervertebral disc degeneration, lumbar region with discogenic back pain only: Secondary | ICD-10-CM | POA: Diagnosis not present

## 2023-10-09 ENCOUNTER — Other Ambulatory Visit: Payer: Self-pay | Admitting: Surgery

## 2023-10-09 DIAGNOSIS — Z9889 Other specified postprocedural states: Secondary | ICD-10-CM

## 2023-10-10 DIAGNOSIS — K5790 Diverticulosis of intestine, part unspecified, without perforation or abscess without bleeding: Secondary | ICD-10-CM | POA: Diagnosis not present

## 2023-10-10 DIAGNOSIS — M797 Fibromyalgia: Secondary | ICD-10-CM | POA: Diagnosis not present

## 2023-10-10 DIAGNOSIS — M5030 Other cervical disc degeneration, unspecified cervical region: Secondary | ICD-10-CM | POA: Diagnosis not present

## 2023-10-10 DIAGNOSIS — M5136 Other intervertebral disc degeneration, lumbar region with discogenic back pain only: Secondary | ICD-10-CM | POA: Diagnosis not present

## 2023-10-10 DIAGNOSIS — Z48812 Encounter for surgical aftercare following surgery on the circulatory system: Secondary | ICD-10-CM | POA: Diagnosis not present

## 2023-10-10 DIAGNOSIS — I2510 Atherosclerotic heart disease of native coronary artery without angina pectoris: Secondary | ICD-10-CM | POA: Diagnosis not present

## 2023-10-10 DIAGNOSIS — G249 Dystonia, unspecified: Secondary | ICD-10-CM | POA: Diagnosis not present

## 2023-10-10 DIAGNOSIS — G4709 Other insomnia: Secondary | ICD-10-CM | POA: Diagnosis not present

## 2023-10-10 DIAGNOSIS — N189 Chronic kidney disease, unspecified: Secondary | ICD-10-CM | POA: Diagnosis not present

## 2023-10-11 ENCOUNTER — Ambulatory Visit (INDEPENDENT_AMBULATORY_CARE_PROVIDER_SITE_OTHER): Payer: Self-pay | Admitting: Surgery

## 2023-10-11 ENCOUNTER — Ambulatory Visit: Payer: Medicare PPO | Admitting: Cardiology

## 2023-10-11 ENCOUNTER — Encounter: Payer: Self-pay | Admitting: Surgery

## 2023-10-11 ENCOUNTER — Other Ambulatory Visit: Payer: Self-pay | Admitting: Surgery

## 2023-10-11 ENCOUNTER — Ambulatory Visit
Admission: RE | Admit: 2023-10-11 | Discharge: 2023-10-11 | Disposition: A | Discharge status: Home / Self Care | Payer: Medicare PPO | Source: Ambulatory Visit | Attending: Surgery | Admitting: Surgery

## 2023-10-11 VITALS — BP 111/68 | HR 75 | Resp 18 | Ht 63.0 in | Wt 145.0 lb

## 2023-10-11 DIAGNOSIS — Z9889 Other specified postprocedural states: Secondary | ICD-10-CM

## 2023-10-11 DIAGNOSIS — J90 Pleural effusion, not elsewhere classified: Secondary | ICD-10-CM | POA: Diagnosis not present

## 2023-10-11 MED ORDER — FUROSEMIDE 40 MG PO TABS: 40.0000 mg | ORAL_TABLET | Freq: Every day | ORAL | 0 refills | Status: DC

## 2023-10-11 MED ORDER — POTASSIUM CHLORIDE CRYS ER 20 MEQ PO TBCR: 20.0000 meq | EXTENDED_RELEASE_TABLET | Freq: Two times a day (BID) | ORAL | 0 refills | Status: DC

## 2023-10-11 NOTE — Progress Notes (Signed)
HPI: Patient returns for routine postoperative follow-up having undergone supra-coronary replacement of the ascending aorta using a 30 mm Hemashield graft under deep hypothermic circulatory arrest for acute type A aortic dissection on 09/11/2023. The patient's early postoperative recovery while in the hospital was notable for an uncomplicated postoperative course. Since hospital discharge the patient reports that she has developed a worsening cough and some shortness of breath.  She has been walking without chest discomfort.  She has been checking her heart rate and blood pressure at home and they have been under good control.  Her weight today was 140 pounds which is the same as it was on January 1.   Current Outpatient Medications  Medication Sig Dispense Refill   acetaminophen (TYLENOL) 325 MG tablet Take 2 tablets (650 mg total) by mouth every 6 (six) hours as needed for mild pain (pain score 1-3).     ascorbic acid (VITAMIN C) 500 MG tablet Take 500 mg by mouth every evening.     aspirin 81 MG chewable tablet Chew 1 tablet (81 mg total) by mouth daily.     benzonatate (TESSALON) 100 MG capsule Take 100 mg by mouth 3 (three) times daily.     cholecalciferol (VITAMIN D3) 25 MCG (1000 UNIT) tablet Take 1,000 Units by mouth every evening.     fluocinonide (LIDEX) 0.05 % external solution Apply 1 Application topically at bedtime as needed (scalp irritation).     fluticasone (FLONASE) 50 MCG/ACT nasal spray Place 1 spray into both nostrils daily as needed for allergies. Night and morning     furosemide (LASIX) 40 MG tablet Take 1 tablet (40 mg total) by mouth daily for 7 days. 7 tablet 0   hydroxychloroquine (PLAQUENIL) 200 MG tablet Take 200 mg by mouth 2 (two) times daily.     levothyroxine (SYNTHROID, LEVOTHROID) 50 MCG tablet Take 50 mcg by mouth daily before breakfast.      Magnesium 250 MG TABS Take 250 mg by mouth every evening.     metoprolol tartrate (LOPRESSOR) 50 MG tablet Take 1  tablet (50 mg total) by mouth 2 (two) times daily. 60 tablet 1   Multiple Vitamins-Minerals (MULTIVITAMIN WOMEN 50+) TABS Take 1 tablet by mouth every evening.     potassium chloride SA (KLOR-CON M) 20 MEQ tablet Take 1 tablet (20 mEq total) by mouth 2 (two) times daily for 7 days. 17 tablet 0   simvastatin (ZOCOR) 40 MG tablet Take 40 mg by mouth every evening.     tacrolimus (PROTOPIC) 0.1 % ointment Apply 1 Application topically daily as needed (scalp irritation).     traZODone (DESYREL) 100 MG tablet Take 50-100 mg by mouth at bedtime as needed for sleep.     No current facility-administered medications for this visit.    Physical Exam: BP 111/68   Pulse 75   Resp 18   Ht 5\' 3"  (1.6 m)   Wt 145 lb (65.8 kg)   SpO2 94% Comment: RA  BMI 25.69 kg/m  She is in no distress but looks tired.  She has a persistent cough with talking. Cardiac exam shows a regular rate and rhythm with normal heart sounds.  There is no murmur.  There is no rub. Lung exam reveals decreased breath sounds over the left lower lobe.  There is decreased breath sounds at the right base. The chest incision and right axillary incision are well-healed.  The sternum is stable. There is mild bilateral lower leg edema.   Diagnostic Tests:  Narrative & Impression  CLINICAL DATA:  s/p aortic dissection.   EXAM: CHEST - 2 VIEW   COMPARISON:  09/17/2023.   FINDINGS: Since the prior study, there is significantly increased, currently at least moderate-to-large left pleural effusion. There are probable associated underlying compressive atelectatic changes with or without consolidation. Correlate clinically.   There is trace right pleural effusion as well. No pneumothorax on either side. No frank pulmonary edema.   Evaluation of cardiomediastinal silhouette is nondiagnostic due to left lower hemithorax opacification.   Sternotomy wires noted.   No acute osseous abnormalities.   The soft tissues are within  normal limits.   IMPRESSION: 1. Moderate-to-large left pleural effusion/atelectatic changes with or without consolidation, significantly increased since the prior study. 2. Trace right pleural effusion.     Electronically Signed   By: Jules Schick M.D.   On: 10/11/2023 08:30      Impression:  She returns about 1 month following her surgery and has a large left pleural effusion causing persistent cough and some shortness of breath with exertion.  There is also a small right pleural effusion.  The left lower effusion will require thoracentesis and we will set this up with IR.  I would like to get this completely drained off so that her left lung can reexpand.  I will also put her on Lasix and potassium for a week to try to resolve the small right pleural effusion.  I asked her not to start Lasix and potassium until after her thoracentesis.  She asked about driving a car but I told her she should not drive until she has had thoracentesis and feeling better.  I asked her not do any heavy lifting for 3 months postoperatively.  Plan:  We will schedule a left thoracentesis with IR as soon as possible.  I will plan to see her back in 2 weeks with a chest x-ray for follow-up.  I sent a prescription for Lasix 40 mg daily and potassium chloride 20 mill equivalents twice daily for 7 days to her pharmacy.  I asked her not to start these until after her thoracentesis was performed.   Alleen Borne, MD [facility] [phone]

## 2023-10-12 ENCOUNTER — Ambulatory Visit (HOSPITAL_COMMUNITY)
Admission: RE | Admit: 2023-10-12 | Discharge: 2023-10-12 | Disposition: A | Discharge status: Home / Self Care | Payer: Medicare PPO | Source: Ambulatory Visit | Attending: Surgery | Admitting: Surgery

## 2023-10-12 ENCOUNTER — Ambulatory Visit (HOSPITAL_COMMUNITY)
Admission: RE | Admit: 2023-10-12 | Discharge: 2023-10-12 | Disposition: A | Discharge status: Home / Self Care | Payer: Medicare PPO | Source: Ambulatory Visit | Attending: Radiology | Admitting: Radiology

## 2023-10-12 DIAGNOSIS — J90 Pleural effusion, not elsewhere classified: Secondary | ICD-10-CM | POA: Diagnosis not present

## 2023-10-12 DIAGNOSIS — Z9889 Other specified postprocedural states: Secondary | ICD-10-CM

## 2023-10-12 HISTORY — PX: IR THORACENTESIS ASP PLEURAL SPACE W/IMG GUIDE: IMG5380

## 2023-10-12 MED ORDER — LIDOCAINE HCL 1 % IJ SOLN
INTRAMUSCULAR | Status: AC
  Filled 2023-10-12: qty 20

## 2023-10-17 DIAGNOSIS — M797 Fibromyalgia: Secondary | ICD-10-CM | POA: Diagnosis not present

## 2023-10-17 DIAGNOSIS — M5030 Other cervical disc degeneration, unspecified cervical region: Secondary | ICD-10-CM | POA: Diagnosis not present

## 2023-10-17 DIAGNOSIS — I2510 Atherosclerotic heart disease of native coronary artery without angina pectoris: Secondary | ICD-10-CM | POA: Diagnosis not present

## 2023-10-17 DIAGNOSIS — M5136 Other intervertebral disc degeneration, lumbar region with discogenic back pain only: Secondary | ICD-10-CM | POA: Diagnosis not present

## 2023-10-17 DIAGNOSIS — G4709 Other insomnia: Secondary | ICD-10-CM | POA: Diagnosis not present

## 2023-10-17 DIAGNOSIS — G249 Dystonia, unspecified: Secondary | ICD-10-CM | POA: Diagnosis not present

## 2023-10-17 DIAGNOSIS — Z48812 Encounter for surgical aftercare following surgery on the circulatory system: Secondary | ICD-10-CM | POA: Diagnosis not present

## 2023-10-17 DIAGNOSIS — K5790 Diverticulosis of intestine, part unspecified, without perforation or abscess without bleeding: Secondary | ICD-10-CM | POA: Diagnosis not present

## 2023-10-17 DIAGNOSIS — N189 Chronic kidney disease, unspecified: Secondary | ICD-10-CM | POA: Diagnosis not present

## 2023-10-19 ENCOUNTER — Ambulatory Visit: Payer: Medicare PPO | Attending: Cardiology | Admitting: Cardiology

## 2023-10-19 VITALS — BP 122/68 | HR 72 | Ht 63.0 in | Wt 140.0 lb

## 2023-10-19 DIAGNOSIS — I7101 Dissection of ascending aorta: Secondary | ICD-10-CM | POA: Diagnosis not present

## 2023-10-19 DIAGNOSIS — J90 Pleural effusion, not elsewhere classified: Secondary | ICD-10-CM | POA: Diagnosis not present

## 2023-10-19 MED ORDER — FUROSEMIDE 40 MG PO TABS: 40.0000 mg | ORAL_TABLET | Freq: Every day | ORAL | 0 refills | Status: DC

## 2023-10-19 NOTE — Progress Notes (Signed)
Cardiology Office Note:  .   Date:  10/19/2023  ID:  TRUDEE CHIRINO, DOB 10-Jul-1951, MRN [id number] PCP: Merri Brunette, MD  [facility] Providers Cardiologist:  None     History of Present Illness: .   STEPANIE GRAVER is a 73 y.o. female Discussed with the use of AI scribe   History of Present Illness   The patient is a 73 year old female who presents for follow-up care after aortic dissection repair. She is accompanied by her sister, Reita Cliche, who has been taking care of her for over a month.  She underwent aortic dissection repair on September 11, 2023, involving a Type A dissection repaired with a 30 mm hemishield graft under deep hypothermic circulatory arrest. Post-operatively, she developed a small right pleural effusion and a left lower lung effusion, which was tapped by interventional radiology, removing 1.1 liters of fluid. She has been taking Lasix (furosemide) since the Saturday following the procedure to manage fluid retention, noting a decrease in weight, although there is a discrepancy between her home scale and the clinic's scale.  She experiences coughing, which she attributes to allergies, and uses Flonase nasal spray twice daily. She completed a course of Mucinex but is unsure if she should continue it. No phlegm production is noted, but she feels mucus pooling in her throat, especially at night. She does not take medications for acid reflux but is open to trying them.  She reports occasional visual disturbances described as 'squiggly zigzaggy white lights', which she associates with low blood pressure. Her blood pressure readings have been low normal, and she has not experienced fainting.  She has not attempted to climb a full flight of stairs but has managed three steps to enter her house. She plans to take it slowly if she attempts more. She has not driven since the surgery and is awaiting clearance from her surgeon. She is currently avoiding caffeine to prevent  potential heart arrhythmias.  She inquires about a hard spot near her incision, suspecting it might be a suture, and plans to discuss it with her surgeon at her next appointment. She also mentions needing a handicap sticker and plans to follow up with her general practitioner for this.      Hospital records, CXR personally reviewed and interpreted and shown to patient and sister, see below.     Studies Reviewed: .        Results   RADIOLOGY Chest X-ray: Interval decrease in left pleural effusion, significant residual fluid  DIAGNOSTIC Thoracentesis: 1.1 liters of fluid removed from left lung (10/12/2023)     Risk Assessment/Calculations:            Physical Exam:   VS:  BP 122/68   Pulse 72   Ht 5\' 3"  (1.6 m)   Wt 140 lb (63.5 kg)   SpO2 98%   BMI 24.80 kg/m    Wt Readings from Last 3 Encounters:  10/19/23 140 lb (63.5 kg)  10/11/23 145 lb (65.8 kg)  09/18/23 144 lb 8 oz (65.5 kg)    GEN: Well nourished, well developed in no acute distress NECK: No JVD; No carotid bruits CARDIAC: Scar well healed RRR, no murmurs, no rubs, no gallops RESPIRATORY:  blunt left  ABDOMEN: Soft, non-tender, non-distended EXTREMITIES:  No edema; No deformity   ASSESSMENT AND PLAN: .    Assessment and Plan    Postoperative Follow-up for Type A Aortic Dissection Repair Follow-up for Type A aortic dissection repair on 09/11/2023  with a 30 mm Hemashield graft under deep hypothermic circulatory arrest. Reports feeling sluggish, which is expected post-surgery. Left lower lung effusion tapped by IR, removing 1.1 liters of fluid. Small right pleural effusion noted. Lasix started post-thoracentesis to manage fluid retention. Reports coughing, likely due to lung re-expansion and possible allergies. No significant sputum production. Blood pressure is low normal. No fainting episodes reported. Discussed potential for cardiac rehab in 6 weeks post-surgery. Advised to avoid caffeine to prevent arrhythmias.  Encouraged light physical activity but to avoid overexertion. - Continue Lasix 40 mg daily for another 7 days - Authorize refill for Lasix for 30 days - Advise to continue using incentive spirometer for deep breathing exercises - Recommend Pepcid 20 mg for potential acid reflux - Suggest Mucinex for congestion - Recommend Zyrtec for allergies - Advise to avoid caffeine to prevent arrhythmias - Encourage light physical activity, such as walking, but avoid overexertion - Discuss potential for cardiac rehab in 6 weeks post-surgery - Follow up with cardiothoracic surgeon on Tuesday for further evaluation and potential additional thoracentesis - Schedule follow-up appointment with cardiologist or APP in 3 months  Pleural Effusion Left lower lung effusion tapped by IR, removing 1.1 liters of fluid. Small right pleural effusion noted. Interval decrease in left pleural effusion observed on X-ray, but significant residual fluid remains. Lasix will hopefully be effective in reducing fluid retention. - Continue Lasix 40 mg daily for another 7 days - Follow up with cardiothoracic surgeon, Dr. Laneta Simmers for potential additional thoracentesis thoughts. - Order follow-up chest X-ray to monitor pleural effusion by TCTS  Allergic Rhinitis Reports coughing and congestion, likely due to allergies. Using Flonase twice daily. No significant sputum production. Symptoms may also be exacerbated by lung re-expansion post-surgery. - Recommend Zyrtec for allergies - Suggest Mucinex for congestion - Continue using Flonase as directed.               Signed, Donato Schultz, MD

## 2023-10-19 NOTE — Patient Instructions (Signed)
Medication Instructions:  Please take Furosemide 40 mg once a day for 7 days.   Continue all other medications as listed.  *If you need a refill on your cardiac medications before your next appointment, please call your pharmacy*  Follow-Up: At [facility], you and your health needs are our priority.  As part of our continuing mission to provide you with exceptional heart care, we have created designated Provider Care Teams.  These Care Teams include your primary Cardiologist (physician) and Advanced Practice Providers (APPs -  Physician Assistants and Nurse Practitioners) who all work together to provide you with the care you need, when you need it.  We recommend signing up for the patient portal called "MyChart".  Sign up information is provided on this After Visit Summary.  MyChart is used to connect with patients for Virtual Visits (Telemedicine).  Patients are able to view lab/test results, encounter notes, upcoming appointments, etc.  Non-urgent messages can be sent to your provider as well.   To learn more about what you can do with MyChart, go to [email or web address].    Your next appointment:   3 month(s)  Provider:   Jari Favre, PA-C, Robin Searing, NP, Eligha Bridegroom, NP, Tereso Newcomer, PA-C, or Perlie Gold, PA-C

## 2023-10-20 DIAGNOSIS — M5030 Other cervical disc degeneration, unspecified cervical region: Secondary | ICD-10-CM | POA: Diagnosis not present

## 2023-10-20 DIAGNOSIS — G249 Dystonia, unspecified: Secondary | ICD-10-CM | POA: Diagnosis not present

## 2023-10-20 DIAGNOSIS — K5790 Diverticulosis of intestine, part unspecified, without perforation or abscess without bleeding: Secondary | ICD-10-CM | POA: Diagnosis not present

## 2023-10-20 DIAGNOSIS — N189 Chronic kidney disease, unspecified: Secondary | ICD-10-CM | POA: Diagnosis not present

## 2023-10-20 DIAGNOSIS — M5136 Other intervertebral disc degeneration, lumbar region with discogenic back pain only: Secondary | ICD-10-CM | POA: Diagnosis not present

## 2023-10-20 DIAGNOSIS — Z48812 Encounter for surgical aftercare following surgery on the circulatory system: Secondary | ICD-10-CM | POA: Diagnosis not present

## 2023-10-20 DIAGNOSIS — I2510 Atherosclerotic heart disease of native coronary artery without angina pectoris: Secondary | ICD-10-CM | POA: Diagnosis not present

## 2023-10-20 DIAGNOSIS — M797 Fibromyalgia: Secondary | ICD-10-CM | POA: Diagnosis not present

## 2023-10-20 DIAGNOSIS — G4709 Other insomnia: Secondary | ICD-10-CM | POA: Diagnosis not present

## 2023-10-23 ENCOUNTER — Other Ambulatory Visit: Payer: Self-pay | Admitting: Surgery

## 2023-10-23 DIAGNOSIS — Z9889 Other specified postprocedural states: Secondary | ICD-10-CM

## 2023-10-24 ENCOUNTER — Ambulatory Visit (INDEPENDENT_AMBULATORY_CARE_PROVIDER_SITE_OTHER): Payer: Self-pay | Admitting: Surgery

## 2023-10-24 ENCOUNTER — Other Ambulatory Visit: Payer: Self-pay | Admitting: Surgery

## 2023-10-24 ENCOUNTER — Encounter: Payer: Self-pay | Admitting: Surgery

## 2023-10-24 ENCOUNTER — Ambulatory Visit
Admission: RE | Admit: 2023-10-24 | Discharge: 2023-10-24 | Disposition: A | Discharge status: Home / Self Care | Payer: Medicare PPO | Source: Ambulatory Visit | Attending: Surgery | Admitting: Surgery

## 2023-10-24 VITALS — BP 128/76 | HR 83 | Resp 20 | Wt 138.0 lb

## 2023-10-24 DIAGNOSIS — I2510 Atherosclerotic heart disease of native coronary artery without angina pectoris: Secondary | ICD-10-CM | POA: Diagnosis not present

## 2023-10-24 DIAGNOSIS — N189 Chronic kidney disease, unspecified: Secondary | ICD-10-CM | POA: Diagnosis not present

## 2023-10-24 DIAGNOSIS — Z9889 Other specified postprocedural states: Secondary | ICD-10-CM

## 2023-10-24 DIAGNOSIS — K5790 Diverticulosis of intestine, part unspecified, without perforation or abscess without bleeding: Secondary | ICD-10-CM | POA: Diagnosis not present

## 2023-10-24 DIAGNOSIS — G4709 Other insomnia: Secondary | ICD-10-CM | POA: Diagnosis not present

## 2023-10-24 DIAGNOSIS — M5136 Other intervertebral disc degeneration, lumbar region with discogenic back pain only: Secondary | ICD-10-CM | POA: Diagnosis not present

## 2023-10-24 DIAGNOSIS — J90 Pleural effusion, not elsewhere classified: Secondary | ICD-10-CM | POA: Diagnosis not present

## 2023-10-24 DIAGNOSIS — M797 Fibromyalgia: Secondary | ICD-10-CM | POA: Diagnosis not present

## 2023-10-24 DIAGNOSIS — M5030 Other cervical disc degeneration, unspecified cervical region: Secondary | ICD-10-CM | POA: Diagnosis not present

## 2023-10-24 DIAGNOSIS — J984 Other disorders of lung: Secondary | ICD-10-CM | POA: Diagnosis not present

## 2023-10-24 DIAGNOSIS — G249 Dystonia, unspecified: Secondary | ICD-10-CM | POA: Diagnosis not present

## 2023-10-24 DIAGNOSIS — Z48812 Encounter for surgical aftercare following surgery on the circulatory system: Secondary | ICD-10-CM | POA: Diagnosis not present

## 2023-10-24 MED ORDER — POTASSIUM CHLORIDE CRYS ER 20 MEQ PO TBCR: 20.0000 meq | EXTENDED_RELEASE_TABLET | Freq: Every day | ORAL | 0 refills | Status: DC

## 2023-10-24 NOTE — Progress Notes (Signed)
 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  She had 1.1 L of serosanguineous fluid removed but reportedly had significant coughing and left-sided chest discomfort during the procedure and it was terminated at that point.  A chest x-ray immediately afterwards showed decrease in the left pleural effusion but still significant residual effusion.  She said that she felt better since that procedure with less shortness of breath but still has some cough.  Current Outpatient Medications  Medication Sig Dispense Refill   acetaminophen  (TYLENOL ) 325 MG tablet Take 2 tablets (650 mg total) by mouth every 6 (six) hours as needed for mild pain (pain score 1-3).     ascorbic acid (VITAMIN C) 500 MG tablet Take 500 mg by mouth every evening.     aspirin  81 MG chewable tablet Chew 1 tablet (81 mg total) by mouth daily.     cholecalciferol (VITAMIN D3) 25 MCG (1000 UNIT) tablet Take 1,000 Units by mouth every evening.     fluocinonide (LIDEX) 0.05 % external solution Apply 1 Application topically at bedtime as needed (scalp irritation).     fluticasone (FLONASE) 50 MCG/ACT nasal spray Place 1 spray into both nostrils daily as needed for allergies. Night and morning     furosemide  (LASIX ) 40 MG tablet Take 1 tablet (40 mg total) by mouth daily. Take 1 tablet a day for the next 7 days.  Hold other tabs for later use as needed 30 tablet 0   hydroxychloroquine (PLAQUENIL) 200 MG tablet Take 200 mg by mouth 2 (two) times daily.     levothyroxine  (SYNTHROID , LEVOTHROID) 50 MCG tablet Take 50 mcg by mouth daily before breakfast.      Magnesium  250 MG TABS Take 250 mg by mouth every evening.     metoprolol  tartrate (LOPRESSOR ) 50 MG tablet Take 1  tablet (50 mg total) by mouth 2 (two) times daily. 60 tablet 1   Multiple Vitamins-Minerals (MULTIVITAMIN WOMEN 50+) TABS Take 1 tablet by mouth every evening.     potassium chloride  SA (KLOR-CON  M) 20 MEQ tablet Take 1 tablet (20 mEq total) by mouth daily. 30 tablet 0   simvastatin  (ZOCOR ) 40 MG tablet Take 40 mg by mouth every evening.     tacrolimus (PROTOPIC) 0.1 % ointment Apply 1 Application topically daily as needed (scalp irritation).     traZODone (DESYREL) 100 MG tablet Take 50-100 mg by mouth at bedtime as needed for sleep.     potassium chloride  SA (KLOR-CON  M) 20 MEQ tablet Take 1 tablet (20 mEq total) by mouth 2 (two) times daily for 7 days. 17 tablet 0   No current facility-administered medications for this visit.     Physical Exam: BP 128/76   Pulse 83   Resp 20   Wt 138 lb (62.6 kg)   SpO2 93% Comment: RA  BMI 24.45 kg/m  She looks well. Cardiac exam shows regular rate and rhythm with normal heart sounds.  There is no murmur. Lung exam reveals decreased breath sounds over the left lower lobe. The chest incision is healing well and the sternum is stable.  Diagnostic Tests:  Narrative & Impression  CLINICAL DATA:  Status post aortic dissection repair 08/2023. Cough.   EXAM: CHEST - 2 VIEW   COMPARISON:  Chest radiograph dated 10/12/2023   FINDINGS: Low lung volumes with bronchovascular crowding. Increased patchy left lower lung opacities. Increased large left pleural effusion. The heart size and mediastinal contours are within normal limits. Median sternotomy wires are nondisplaced. Surgical clips project over the right upper chest.   IMPRESSION: 1. Increased large left pleural effusion. 2. Increased patchy left lower lung opacities, which may represent atelectasis, aspiration, or pneumonia.     Electronically Signed   By: Limin  Xu M.D.   On: 10/24/2023 16:20      Impression:  She still has a large left pleural effusion after left thoracentesis  with removal of 1.1 L on 10/12/2023.  I suspect there is still greater than 1 L of fluid in the chest with significant compressive atelectasis of the left lower lobe.  She is symptomatically better but still having some cough and exertional shortness of breath.  I think the best course of action would be to repeat the left thoracentesis to try to completely drain the effusion.  She saw Dr. Jeffrie on 10/19/2023 and he continued her on Lasix  40 mg daily for another 7 days.  I told her that she should continue the Lasix  40 mg daily for now and I sent in a prescription for potassium chloride  20 mill equivalents daily while she is on the Lasix .  She is otherwise making a good recovery from her major surgery.  Plan:  She will be scheduled for left thoracentesis by IR.  I will see her back 2 weeks later with a chest x-ray.   Dorise MARLA Fellers, MD [facility] [phone]

## 2023-10-26 ENCOUNTER — Ambulatory Visit (HOSPITAL_COMMUNITY)
Admission: RE | Admit: 2023-10-26 | Discharge: 2023-10-26 | Disposition: A | Discharge status: Home / Self Care | Payer: Medicare PPO | Source: Ambulatory Visit | Attending: Surgery | Admitting: Surgery

## 2023-10-30 ENCOUNTER — Ambulatory Visit (HOSPITAL_COMMUNITY)
Admission: RE | Admit: 2023-10-30 | Discharge: 2023-10-30 | Disposition: A | Discharge status: Home / Self Care | Payer: Medicare PPO | Source: Ambulatory Visit | Attending: Surgery | Admitting: Surgery

## 2023-10-30 ENCOUNTER — Ambulatory Visit (HOSPITAL_COMMUNITY)
Admission: RE | Admit: 2023-10-30 | Discharge: 2023-10-30 | Disposition: A | Discharge status: Home / Self Care | Payer: Medicare PPO | Source: Ambulatory Visit | Attending: Urology | Admitting: Urology

## 2023-10-30 VITALS — BP 129/73

## 2023-10-30 DIAGNOSIS — J90 Pleural effusion, not elsewhere classified: Secondary | ICD-10-CM | POA: Diagnosis not present

## 2023-10-30 DIAGNOSIS — Z9889 Other specified postprocedural states: Secondary | ICD-10-CM

## 2023-10-30 DIAGNOSIS — Z48813 Encounter for surgical aftercare following surgery on the respiratory system: Secondary | ICD-10-CM | POA: Diagnosis not present

## 2023-10-30 HISTORY — PX: IR THORACENTESIS ASP PLEURAL SPACE W/IMG GUIDE: IMG5380

## 2023-10-30 MED ORDER — LIDOCAINE HCL 1 % IJ SOLN
INTRAMUSCULAR | Status: AC
  Filled 2023-10-30: qty 20

## 2023-10-30 MED ORDER — LIDOCAINE HCL 1 % IJ SOLN
20.0000 mL | Freq: Once | INTRAMUSCULAR | Status: AC
  Administered 2023-10-30: 10 mL

## 2023-10-30 NOTE — Procedures (Signed)
 PROCEDURE SUMMARY:  Successful US  guided left thoracentesis. Yielded 550 mL of amber fluid. Patient tolerated procedure well. No immediate complications. EBL = trace  Post procedure chest X-ray reveals no pneumothorax  Pasty Bongo PA-C 10/30/2023 9:07 AM

## 2023-11-01 DIAGNOSIS — G4709 Other insomnia: Secondary | ICD-10-CM | POA: Diagnosis not present

## 2023-11-01 DIAGNOSIS — Z48812 Encounter for surgical aftercare following surgery on the circulatory system: Secondary | ICD-10-CM | POA: Diagnosis not present

## 2023-11-01 DIAGNOSIS — M797 Fibromyalgia: Secondary | ICD-10-CM | POA: Diagnosis not present

## 2023-11-01 DIAGNOSIS — N189 Chronic kidney disease, unspecified: Secondary | ICD-10-CM | POA: Diagnosis not present

## 2023-11-01 DIAGNOSIS — M5136 Other intervertebral disc degeneration, lumbar region with discogenic back pain only: Secondary | ICD-10-CM | POA: Diagnosis not present

## 2023-11-01 DIAGNOSIS — G249 Dystonia, unspecified: Secondary | ICD-10-CM | POA: Diagnosis not present

## 2023-11-01 DIAGNOSIS — I2510 Atherosclerotic heart disease of native coronary artery without angina pectoris: Secondary | ICD-10-CM | POA: Diagnosis not present

## 2023-11-01 DIAGNOSIS — K5790 Diverticulosis of intestine, part unspecified, without perforation or abscess without bleeding: Secondary | ICD-10-CM | POA: Diagnosis not present

## 2023-11-01 DIAGNOSIS — M5030 Other cervical disc degeneration, unspecified cervical region: Secondary | ICD-10-CM | POA: Diagnosis not present

## 2023-11-02 ENCOUNTER — Encounter: Payer: Self-pay | Admitting: Surgery

## 2023-11-07 ENCOUNTER — Other Ambulatory Visit: Payer: Self-pay | Admitting: Surgery

## 2023-11-07 DIAGNOSIS — Z9889 Other specified postprocedural states: Secondary | ICD-10-CM

## 2023-11-08 ENCOUNTER — Ambulatory Visit (INDEPENDENT_AMBULATORY_CARE_PROVIDER_SITE_OTHER): Payer: Self-pay | Admitting: Surgery

## 2023-11-08 ENCOUNTER — Encounter: Payer: Self-pay | Admitting: Surgery

## 2023-11-08 ENCOUNTER — Ambulatory Visit
Admission: RE | Admit: 2023-11-08 | Discharge: 2023-11-08 | Disposition: A | Discharge status: Home / Self Care | Payer: Medicare PPO | Source: Ambulatory Visit | Attending: Surgery | Admitting: Surgery

## 2023-11-08 VITALS — BP 118/76 | HR 78 | Resp 18 | Ht 63.0 in | Wt 140.0 lb

## 2023-11-08 DIAGNOSIS — J9811 Atelectasis: Secondary | ICD-10-CM | POA: Diagnosis not present

## 2023-11-08 DIAGNOSIS — J90 Pleural effusion, not elsewhere classified: Secondary | ICD-10-CM | POA: Diagnosis not present

## 2023-11-08 DIAGNOSIS — Z9889 Other specified postprocedural states: Secondary | ICD-10-CM

## 2023-11-08 NOTE — Progress Notes (Signed)
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  She had an uneventful postoperative course but then developed a large left pleural effusion after discharge and underwent left thoracentesis by IR on 10/12/2023 removing 1.1 L of serosanguineous fluid.  Her follow-up x-ray still showed a significant left pleural effusion and I sent her for a repeat thoracentesis on 10/30/2023 removing another 550 cc of fluid from the left chest.  A chest x-ray at the conclusion of that procedure showed improved aeration of the left lung base.  She said that her breathing has much improved.  She has occasional coughing but much improved.  She has had some pains in her left shoulder but occasionally has some in her right shoulder to posteriorly.  She has been walking short distances in her house.  She said that she still tires easily.  Current Outpatient Medications  Medication Sig Dispense Refill   acetaminophen (TYLENOL) 325 MG tablet Take 2 tablets (650 mg total) by mouth every 6 (six) hours as needed for mild pain (pain score 1-3).     ascorbic acid (VITAMIN C) 500 MG tablet Take 500 mg by mouth every evening.     aspirin 81 MG chewable tablet Chew 1 tablet (81 mg total) by mouth daily.     cholecalciferol (VITAMIN D3) 25 MCG (1000 UNIT) tablet Take 1,000 Units by mouth every evening.     fluocinonide (LIDEX) 0.05 % external solution Apply 1 Application topically at bedtime as needed (scalp irritation).     fluticasone (FLONASE) 50 MCG/ACT nasal spray Place 1 spray into both nostrils daily as needed for allergies. Night and morning     furosemide (LASIX) 40 MG tablet Take 1 tablet (40 mg total) by mouth daily. Take 1 tablet a day for the next 7 days.  Hold other tabs for later use as needed 30 tablet 0   hydroxychloroquine (PLAQUENIL) 200  MG tablet Take 200 mg by mouth 2 (two) times daily.     levothyroxine (SYNTHROID, LEVOTHROID) 50 MCG tablet Take 50 mcg by mouth daily before breakfast.      Magnesium 250 MG TABS Take 250 mg by mouth every evening.     metoprolol tartrate (LOPRESSOR) 50 MG tablet Take 1 tablet (50 mg total) by mouth 2 (two) times daily. 60 tablet 1   Multiple Vitamins-Minerals (MULTIVITAMIN WOMEN 50+) TABS Take 1 tablet by mouth every evening.     potassium chloride SA (KLOR-CON M) 20 MEQ tablet Take 1 tablet (20 mEq total) by mouth daily. 30 tablet 0   simvastatin (ZOCOR) 40 MG tablet Take 40 mg by mouth every evening.     tacrolimus (PROTOPIC) 0.1 % ointment Apply 1 Application topically daily as needed (scalp irritation).     traZODone (DESYREL) 100 MG tablet Take 50-100 mg by mouth at bedtime as needed for sleep.     potassium chloride SA (KLOR-CON M) 20 MEQ tablet Take 1 tablet (20 mEq total) by mouth 2 (two) times daily for 7 days. 17 tablet 0   No current facility-administered medications for this visit.     Physical Exam: BP 118/76   Pulse 78   Resp 18   Ht 5\' 3"  (1.6 m)   Wt 140 lb (63.5 kg)   SpO2 97%   BMI 24.80 kg/m  She looks well. Cardiac exam shows a  regular rate and rhythm with normal heart sounds.  There is no murmur. Lungs are clear. The chest incision is healing well and the sternum is stable. There is no peripheral edema.  Diagnostic Tests:  Narrative & Impression  CLINICAL DATA:  Status post repair of aortic dissection.   EXAM: CHEST - 2 VIEW   COMPARISON:  October 30, 2023.   FINDINGS: The heart size and mediastinal contours are within normal limits. Sternotomy wires are noted. Right lung is clear. No pneumothorax. Small left pleural effusion with adjacent subsegmental atelectasis. The visualized skeletal structures are unremarkable.   IMPRESSION: Small left pleural effusion with adjacent subsegmental atelectasis.     Electronically Signed   By: Lupita Raider M.D.   On: 11/08/2023 10:46    Impression:  Overall I think she is doing well 2 months following her surgery.  Chest x-ray shows a small residual left pleural effusion with some subsegmental atelectasis at the left base.  I encouraged her to continue using her incentive spirometer and increase the distance that she is walking.  I asked her not do any heavy lifting for 3 months postoperatively.  Plan:  I will see her back in 1 month with a repeat chest x-ray for follow-up.   Alleen Borne, MD [facility] [phone]

## 2023-11-09 ENCOUNTER — Ambulatory Visit: Payer: Medicare PPO | Admitting: Surgery

## 2023-11-13 ENCOUNTER — Other Ambulatory Visit: Payer: Self-pay | Admitting: Cardiology

## 2023-11-13 NOTE — Telephone Encounter (Signed)
 Pt's pharmacy is requesting a refill on furosemide. Pt was supposed to take medication for 7 days. Does Dr. Anne Fu want pt to continue taking this medication? Please address

## 2023-11-15 ENCOUNTER — Other Ambulatory Visit: Payer: Self-pay | Admitting: Surgical

## 2023-11-16 ENCOUNTER — Other Ambulatory Visit: Payer: Self-pay | Admitting: Cardiology

## 2023-11-16 NOTE — Telephone Encounter (Signed)
*  STAT* If patient is at the pharmacy, call can be transferred to refill team.   1. Which medications need to be refilled? (please list name of each medication and dose if known) metoprolol tartrate (LOPRESSOR) 50 MG tablet   2. Which pharmacy/location (including street and city if local pharmacy) is medication to be sent to?  [facility] - [place], [place] - [address]    3. Do they need a 30 day or 90 day supply? 90

## 2023-11-16 NOTE — Telephone Encounter (Signed)
 Pt requesting a refill on medication metoprolol. Would Dr. Anne Fu like to refill this medication? Please address

## 2023-11-17 MED ORDER — METOPROLOL TARTRATE 50 MG PO TABS: 50.0000 mg | ORAL_TABLET | Freq: Two times a day (BID) | ORAL | 1 refills | Status: DC

## 2023-11-20 ENCOUNTER — Other Ambulatory Visit: Payer: Self-pay | Admitting: Surgery

## 2023-11-22 ENCOUNTER — Encounter: Payer: Self-pay | Admitting: Cardiology

## 2023-11-22 ENCOUNTER — Other Ambulatory Visit: Payer: Self-pay | Admitting: Cardiology

## 2023-11-22 MED ORDER — POTASSIUM CHLORIDE CRYS ER 20 MEQ PO TBCR: 20.0000 meq | EXTENDED_RELEASE_TABLET | Freq: Every day | ORAL | 2 refills | Status: DC

## 2023-12-11 ENCOUNTER — Other Ambulatory Visit: Payer: Self-pay | Admitting: Surgery

## 2023-12-11 ENCOUNTER — Encounter: Payer: Self-pay | Admitting: Surgery

## 2023-12-11 DIAGNOSIS — Z9889 Other specified postprocedural states: Secondary | ICD-10-CM

## 2023-12-13 ENCOUNTER — Ambulatory Visit
Admission: RE | Admit: 2023-12-13 | Discharge: 2023-12-13 | Disposition: A | Discharge status: Home / Self Care | Source: Ambulatory Visit | Attending: Surgery | Admitting: Surgery

## 2023-12-13 ENCOUNTER — Encounter: Payer: Self-pay | Admitting: Surgery

## 2023-12-13 ENCOUNTER — Ambulatory Visit (INDEPENDENT_AMBULATORY_CARE_PROVIDER_SITE_OTHER): Payer: Self-pay | Admitting: Surgery

## 2023-12-13 VITALS — BP 132/75 | HR 73 | Resp 18 | Ht 63.0 in | Wt 143.0 lb

## 2023-12-13 DIAGNOSIS — R918 Other nonspecific abnormal finding of lung field: Secondary | ICD-10-CM | POA: Diagnosis not present

## 2023-12-13 DIAGNOSIS — Z9889 Other specified postprocedural states: Secondary | ICD-10-CM

## 2023-12-13 NOTE — Progress Notes (Unsigned)
    HPI: ***  Current Outpatient Medications  Medication Sig Dispense Refill   acetaminophen (TYLENOL) 325 MG tablet Take 2 tablets (650 mg total) by mouth every 6 (six) hours as needed for mild pain (pain score 1-3).     ascorbic acid (VITAMIN C) 500 MG tablet Take 500 mg by mouth every evening.     aspirin 81 MG chewable tablet Chew 1 tablet (81 mg total) by mouth daily.     cholecalciferol (VITAMIN D3) 25 MCG (1000 UNIT) tablet Take 1,000 Units by mouth every evening.     fluocinonide (LIDEX) 0.05 % external solution Apply 1 Application topically at bedtime as needed (scalp irritation).     fluticasone (FLONASE) 50 MCG/ACT nasal spray Place 1 spray into both nostrils daily as needed for allergies. Night and morning     furosemide (LASIX) 40 MG tablet Take 1 tablet (40 mg total) by mouth daily. 30 tablet 2   hydroxychloroquine (PLAQUENIL) 200 MG tablet Take 200 mg by mouth 2 (two) times daily.     levothyroxine (SYNTHROID, LEVOTHROID) 50 MCG tablet Take 50 mcg by mouth daily before breakfast.      Magnesium 250 MG TABS Take 250 mg by mouth every evening.     metoprolol tartrate (LOPRESSOR) 50 MG tablet Take 1 tablet (50 mg total) by mouth 2 (two) times daily. 60 tablet 1   Multiple Vitamins-Minerals (MULTIVITAMIN WOMEN 50+) TABS Take 1 tablet by mouth every evening.     potassium chloride SA (KLOR-CON M) 20 MEQ tablet Take 1 tablet (20 mEq total) by mouth daily. 30 tablet 2   simvastatin (ZOCOR) 40 MG tablet Take 40 mg by mouth every evening.     tacrolimus (PROTOPIC) 0.1 % ointment Apply 1 Application topically daily as needed (scalp irritation).     traZODone (DESYREL) 100 MG tablet Take 50-100 mg by mouth at bedtime as needed for sleep.     No current facility-administered medications for this visit.     Physical Exam: ***  Diagnostic Tests: ***  Impression: ***  Plan: ***   Alleen Borne, MD [facility] [phone]

## 2024-01-10 ENCOUNTER — Encounter: Payer: Self-pay | Admitting: Surgery

## 2024-01-15 NOTE — Progress Notes (Unsigned)
      [address]       [place] [address]             [phone]       HPI: Ms. Raabe is a 73 year old patient with a past medical history of ***. The patient underwent *** by Dr. Sherene Dilling on ***.    Current Outpatient Medications  Medication Sig Dispense Refill   acetaminophen  (TYLENOL ) 325 MG tablet Take 2 tablets (650 mg total) by mouth every 6 (six) hours as needed for mild pain (pain score 1-3).     ascorbic acid (VITAMIN C) 500 MG tablet Take 500 mg by mouth every evening.     aspirin  81 MG chewable tablet Chew 1 tablet (81 mg total) by mouth daily.     cholecalciferol (VITAMIN D3) 25 MCG (1000 UNIT) tablet Take 1,000 Units by mouth every evening.     fluocinonide (LIDEX) 0.05 % external solution Apply 1 Application topically at bedtime as needed (scalp irritation).     fluticasone (FLONASE) 50 MCG/ACT nasal spray Place 1 spray into both nostrils daily as needed for allergies. Night and morning     furosemide  (LASIX ) 40 MG tablet Take 1 tablet (40 mg total) by mouth daily. 30 tablet 2   hydroxychloroquine (PLAQUENIL) 200 MG tablet Take 200 mg by mouth 2 (two) times daily.     levothyroxine  (SYNTHROID , LEVOTHROID) 50 MCG tablet Take 50 mcg by mouth daily before breakfast.      Magnesium  250 MG TABS Take 250 mg by mouth every evening.     metoprolol  tartrate (LOPRESSOR ) 50 MG tablet Take 1 tablet (50 mg total) by mouth 2 (two) times daily. 60 tablet 1   Multiple Vitamins-Minerals (MULTIVITAMIN WOMEN 50+) TABS Take 1 tablet by mouth every evening.     potassium chloride  SA (KLOR-CON  M) 20 MEQ tablet Take 1 tablet (20 mEq total) by mouth daily. 30 tablet 2   simvastatin  (ZOCOR ) 40 MG tablet Take 40 mg by mouth every evening.     tacrolimus (PROTOPIC) 0.1 % ointment Apply 1 Application topically daily as needed (scalp irritation).     traZODone (DESYREL) 100 MG tablet Take 50-100 mg by mouth at bedtime as needed for sleep.     No current facility-administered  medications for this visit.   Vitals:  Physical Exam: ***  Diagnostic Tests: ***  Impression/Plan: ***   Randa Burton, PA-C [facility] [phone]

## 2024-01-16 ENCOUNTER — Other Ambulatory Visit: Payer: Self-pay | Admitting: Cardiology

## 2024-01-17 ENCOUNTER — Encounter: Payer: Self-pay | Admitting: Physician Assistant

## 2024-01-17 ENCOUNTER — Ambulatory Visit: Payer: Medicare PPO | Admitting: Physician Assistant

## 2024-01-17 ENCOUNTER — Ambulatory Visit: Attending: Surgery | Admitting: Physician Assistant

## 2024-01-17 VITALS — BP 129/80 | HR 67 | Resp 18 | Ht 63.0 in | Wt 144.0 lb

## 2024-01-17 DIAGNOSIS — Z9889 Other specified postprocedural states: Secondary | ICD-10-CM | POA: Diagnosis not present

## 2024-01-23 DIAGNOSIS — E7800 Pure hypercholesterolemia, unspecified: Secondary | ICD-10-CM | POA: Diagnosis not present

## 2024-01-23 DIAGNOSIS — G248 Other dystonia: Secondary | ICD-10-CM | POA: Diagnosis not present

## 2024-01-23 DIAGNOSIS — Z23 Encounter for immunization: Secondary | ICD-10-CM | POA: Diagnosis not present

## 2024-01-23 DIAGNOSIS — Z0000 Encounter for general adult medical examination without abnormal findings: Secondary | ICD-10-CM | POA: Diagnosis not present

## 2024-01-23 DIAGNOSIS — E039 Hypothyroidism, unspecified: Secondary | ICD-10-CM | POA: Diagnosis not present

## 2024-01-23 DIAGNOSIS — E559 Vitamin D deficiency, unspecified: Secondary | ICD-10-CM | POA: Diagnosis not present

## 2024-01-23 DIAGNOSIS — Z1331 Encounter for screening for depression: Secondary | ICD-10-CM | POA: Diagnosis not present

## 2024-01-23 DIAGNOSIS — G4700 Insomnia, unspecified: Secondary | ICD-10-CM | POA: Diagnosis not present

## 2024-01-23 LAB — LAB REPORT - SCANNED: EGFR: 52

## 2024-01-24 DIAGNOSIS — Z79899 Other long term (current) drug therapy: Secondary | ICD-10-CM | POA: Diagnosis not present

## 2024-01-24 DIAGNOSIS — L6611 Classic lichen planopilaris: Secondary | ICD-10-CM | POA: Diagnosis not present

## 2024-01-24 DIAGNOSIS — L438 Other lichen planus: Secondary | ICD-10-CM | POA: Diagnosis not present

## 2024-01-31 DIAGNOSIS — G4700 Insomnia, unspecified: Secondary | ICD-10-CM | POA: Diagnosis not present

## 2024-01-31 DIAGNOSIS — M1990 Unspecified osteoarthritis, unspecified site: Secondary | ICD-10-CM | POA: Diagnosis not present

## 2024-01-31 DIAGNOSIS — N1831 Chronic kidney disease, stage 3a: Secondary | ICD-10-CM | POA: Diagnosis not present

## 2024-01-31 DIAGNOSIS — I2510 Atherosclerotic heart disease of native coronary artery without angina pectoris: Secondary | ICD-10-CM | POA: Diagnosis not present

## 2024-01-31 DIAGNOSIS — I700 Atherosclerosis of aorta: Secondary | ICD-10-CM | POA: Diagnosis not present

## 2024-01-31 DIAGNOSIS — I129 Hypertensive chronic kidney disease with stage 1 through stage 4 chronic kidney disease, or unspecified chronic kidney disease: Secondary | ICD-10-CM | POA: Diagnosis not present

## 2024-01-31 DIAGNOSIS — I719 Aortic aneurysm of unspecified site, without rupture: Secondary | ICD-10-CM | POA: Diagnosis not present

## 2024-01-31 DIAGNOSIS — Z7982 Long term (current) use of aspirin: Secondary | ICD-10-CM | POA: Diagnosis not present

## 2024-01-31 DIAGNOSIS — E785 Hyperlipidemia, unspecified: Secondary | ICD-10-CM | POA: Diagnosis not present

## 2024-02-13 ENCOUNTER — Other Ambulatory Visit: Payer: Self-pay | Admitting: Surgery

## 2024-02-13 DIAGNOSIS — Z9889 Other specified postprocedural states: Secondary | ICD-10-CM

## 2024-02-17 ENCOUNTER — Encounter: Payer: Self-pay | Admitting: Cardiology

## 2024-02-17 ENCOUNTER — Other Ambulatory Visit: Payer: Self-pay | Admitting: Cardiology

## 2024-02-19 ENCOUNTER — Other Ambulatory Visit: Payer: Self-pay

## 2024-02-19 MED ORDER — METOPROLOL TARTRATE 50 MG PO TABS: 50.0000 mg | ORAL_TABLET | Freq: Two times a day (BID) | ORAL | 0 refills | Status: DC

## 2024-02-28 ENCOUNTER — Ambulatory Visit: Attending: Cardiology | Admitting: Physician Assistant

## 2024-02-28 ENCOUNTER — Encounter: Payer: Self-pay | Admitting: Physician Assistant

## 2024-02-28 VITALS — BP 115/62 | HR 64 | Ht 62.0 in | Wt 145.0 lb

## 2024-02-28 DIAGNOSIS — B354 Tinea corporis: Secondary | ICD-10-CM

## 2024-02-28 DIAGNOSIS — I7101 Dissection of ascending aorta: Secondary | ICD-10-CM

## 2024-02-28 NOTE — Assessment & Plan Note (Signed)
 Status post aortic dissection repair in December 2024. She had recurrent pleural effusions requiring thoracentesis x 2 after surgery. She is not having any symptoms to suggest recurrence of pleural effusion. She would like to stop Lasix . It has been several months since her surgery. It seems reasonable for her to stop her Lasix  now. She has a follow up scan and appt with Dr. Sherene Dilling in the next 2 weeks. Blood pressure is well-controlled with metoprolol . No family history of aortic dissection or aneurysm. Advised to avoid activities causing straining. Emphasized maintaining blood pressure control to reduce recurrence risk. She has R pectoralis muscle atrophy. She was seeing ortho prior to her dissection for rotator cuff issues. She remains weak and has difficulty lifting things, turning on the her mower blades, etc. Question if she may need referral to PT. I would rather she have her scan and follow up with Dr. Sherene Dilling before deciding on this.  - Continue metoprolol  tartrate 50 mg twice daily  - Avoid activities that require straining  - Monitor heart rate during exercise, keeping it around 118 bpm - Discontinue Lasix , potassium  - Monitor blood pressure at home and call if BP exceeds 130/90 mmHg - Keep follow up with Dr. Sherene Dilling as planned after her CT scan.

## 2024-02-28 NOTE — Progress Notes (Signed)
 OFFICE NOTE Date:  02/28/2024  ID:  Darlene Burns, DOB 1950/11/07, MRN [id number] PCP: Faustina Hood, MD  [facility] Providers Cardiologist:  Dorothye Gathers, MD     Patient Profile:     Type A Aortic dissection s/p repair in 08/2023 Intraoperative TEE 08/2023: EF 60-65 Repair c/b pleural effusion requiring L thoracentesis Residual effusion - S/p L thoracentesis 10/2023       Discussed the use of AI scribe software for clinical note transcription with the patient, who gave verbal consent to proceed. History of Present Illness Darlene Burns is a 73 y.o. female who returns for follow up of aortic dissection s/p repair. She was seen by Dr. Renna Cary in 09/2023, after her repair. She had follow up with Dr. Sherene Dilling and was set up for a repeat thoracentesis. Her last CXR showed no residual effusion.   She continues to take Lasix . No current orthopnea, but she experiences exertional dyspnea, which she attributes to being 'terribly lazy'. She has a mild cough over the last few days, believed to be due to allergies. She recalls significant dyspnea and violent coughing prior to her thoracentesis.  Her current symptoms are not like this.  She is continue to have swelling around the right axillary cannulation scar, which has decreased over time but has not completely resolved. She is scheduled for a follow-up CT scan next Wednesday. She also notes muscle weakness, particularly in her right pectoralis muscle.  She appears to have right pectoralis muscle atrophy since surgery. She is working on strengthening these muscles through exercises like Geophysicist/field seismologist. She experiences difficulty with physical tasks such as using a riding mower due to weakness. She reports treating a redness under her skin, suspected to be a yeast infection, with a cream. She has no family history of aortic dissections or aneurysms. Her blood pressure readings are typically around 115/62 to 120/84.    ROS-See HPI    Studies  Reviewed:       Risk Assessment/Calculations:          Physical Exam:  VS:  BP 115/62   Pulse 64   Ht 5' 2 (1.575 m)   Wt 145 lb (65.8 kg)   SpO2 95%   BMI 26.52 kg/m    Wt Readings from Last 3 Encounters:  02/28/24 145 lb (65.8 kg)  01/17/24 144 lb (65.3 kg)  12/13/23 143 lb (64.9 kg)    Constitutional:      Appearance: Healthy appearance. Not in distress.  Neck:     Vascular: JVD normal.  Pulmonary:     Breath sounds: Normal breath sounds. No wheezing. No rales.  Cardiovascular:     Normal rate. Regular rhythm.     Murmurs: There is no murmur.  Edema:    Peripheral edema absent.  Abdominal:     Palpations: Abdomen is soft.  Skin:    Findings: Rash (tinea on chest around breasts) present.        Assessment and Plan: Assessment & Plan Dissection of ascending aorta [facility]) Status post aortic dissection repair in December 2024. She had recurrent pleural effusions requiring thoracentesis x 2 after surgery. She is not having any symptoms to suggest recurrence of pleural effusion. She would like to stop Lasix . It has been several months since her surgery. It seems reasonable for her to stop her Lasix  now. She has a follow up scan and appt with Dr. Sherene Dilling in the next 2 weeks. Blood pressure is well-controlled with metoprolol . No family history  of aortic dissection or aneurysm. Advised to avoid activities causing straining. Emphasized maintaining blood pressure control to reduce recurrence risk. She has R pectoralis muscle atrophy. She was seeing ortho prior to her dissection for rotator cuff issues. She remains weak and has difficulty lifting things, turning on the her mower blades, etc. Question if she may need referral to PT. I would rather she have her scan and follow up with Dr. Sherene Dilling before deciding on this.  - Continue metoprolol  tartrate 50 mg twice daily  - Avoid activities that require straining  - Monitor heart rate during exercise, keeping it around 118 bpm -  Discontinue Lasix , potassium  - Monitor blood pressure at home and call if BP exceeds 130/90 mmHg - Keep follow up with Dr. Sherene Dilling as planned after her CT scan.   Tinea corporis I recommended she get miconazole aerosol spray OTC and use it twice daily for 2 weeks.           Dispo:  Return in about 6 months (around 08/29/2024) for Routine Follow Up, w/ Dr. Renna Cary, or Marlyse Single, PA-C. Signed, Marlyse Single, PA-C

## 2024-02-28 NOTE — Patient Instructions (Addendum)
 Medication Instructions:  Your physician has recommended you make the following change in your medication:   STOP Lasix   STOP Potassium  *If you need a refill on your cardiac medications before your next appointment, please call your pharmacy*  Lab Work: None ordered  If you have labs (blood work) drawn today and your tests are completely normal, you will receive your results only by: MyChart Message (if you have MyChart) OR A paper copy in the mail If you have any lab test that is abnormal or we need to change your treatment, we will call you to review the results.  Testing/Procedures: None ordered  Follow-Up: At [facility], you and your health needs are our priority.  As part of our continuing mission to provide you with exceptional heart care, our providers are all part of one team.  This team includes your primary Cardiologist (physician) and Advanced Practice Providers or APPs (Physician Assistants and Nurse Practitioners) who all work together to provide you with the care you need, when you need it.  Your next appointment:   6 month(s)  Provider:   Dorothye Gathers, MD or Marlyse Single, PA-C          We recommend signing up for the patient portal called MyChart.  Sign up information is provided on this After Visit Summary.  MyChart is used to connect with patients for Virtual Visits (Telemedicine).  Patients are able to view lab/test results, encounter notes, upcoming appointments, etc.  Non-urgent messages can be sent to your provider as well.   To learn more about what you can do with MyChart, go to [email or web address].   Other Instructions You can get over the counter miconazole aerosol powder and spray it on your chest area twice a day for 2 weeks

## 2024-03-01 ENCOUNTER — Other Ambulatory Visit: Payer: Self-pay | Admitting: Cardiology

## 2024-03-04 ENCOUNTER — Other Ambulatory Visit: Payer: Self-pay | Admitting: Cardiology

## 2024-03-04 ENCOUNTER — Encounter: Payer: Self-pay | Admitting: Cardiology

## 2024-03-04 MED ORDER — METOPROLOL TARTRATE 50 MG PO TABS: 50.0000 mg | ORAL_TABLET | Freq: Two times a day (BID) | ORAL | 3 refills | Status: AC

## 2024-03-06 ENCOUNTER — Ambulatory Visit (HOSPITAL_COMMUNITY)
Admission: RE | Admit: 2024-03-06 | Discharge: 2024-03-06 | Disposition: A | Discharge status: Home / Self Care | Source: Ambulatory Visit | Attending: Surgery | Admitting: Surgery

## 2024-03-06 DIAGNOSIS — I700 Atherosclerosis of aorta: Secondary | ICD-10-CM | POA: Insufficient documentation

## 2024-03-06 DIAGNOSIS — I7100 Dissection of unspecified site of aorta: Secondary | ICD-10-CM | POA: Insufficient documentation

## 2024-03-06 DIAGNOSIS — Z9889 Other specified postprocedural states: Secondary | ICD-10-CM | POA: Insufficient documentation

## 2024-03-06 DIAGNOSIS — I517 Cardiomegaly: Secondary | ICD-10-CM | POA: Diagnosis not present

## 2024-03-06 MED ORDER — IOHEXOL 350 MG/ML SOLN
75.0000 mL | Freq: Once | INTRAVENOUS | Status: AC | PRN
Start: 2024-03-06 — End: 2024-03-06
  Administered 2024-03-06: 75 mL via INTRAVENOUS

## 2024-03-13 ENCOUNTER — Ambulatory Visit: Attending: Surgery | Admitting: Surgery

## 2024-03-13 ENCOUNTER — Encounter: Payer: Self-pay | Admitting: Surgery

## 2024-03-13 VITALS — BP 120/68 | HR 73 | Resp 18 | Ht 62.0 in | Wt 145.0 lb

## 2024-03-13 DIAGNOSIS — Z9889 Other specified postprocedural states: Secondary | ICD-10-CM

## 2024-03-13 NOTE — Progress Notes (Signed)
 [address] [place] [address]             [phone]    HPI:  The patient is a 73 year old woman who underwent supra-coronary replacement of the ascending aorta using a 30 mm Hemashield graft under deep hypothermic circulatory arrest for acute type A aortic dissection on 09/11/2023.  She had an uneventful postoperative course but then developed a large left pleural effusion after discharge and underwent left thoracentesis by IR on 10/12/2023 removing 1.1 L of serosanguineous fluid.  Her follow-up x-ray still showed a significant left pleural effusion and I sent her for a repeat thoracentesis on 10/30/2023 removing another 550 cc of fluid from the left chest.  A chest x-ray at the conclusion of that procedure showed improved aeration of the left lung base.  When I last saw her on 11/08/2023 her chest x-ray showed a small residual left pleural effusion with adjacent subsegmental atelectasis.  She has continued to increase her activity and is walking without chest pain or shortness of breath.  She feels like her energy level is not back to normal yet.  Current Outpatient Medications  Medication Sig Dispense Refill   acetaminophen  (TYLENOL ) 325 MG tablet Take 2 tablets (650 mg total) by mouth every 6 (six) hours as needed for mild pain (pain score 1-3).     ascorbic acid (VITAMIN C) 500 MG tablet Take 500 mg by mouth every evening.     aspirin  81 MG chewable tablet Chew 1 tablet (81 mg total) by mouth daily.     cholecalciferol (VITAMIN D3) 25 MCG (1000 UNIT) tablet Take 1,000 Units by mouth every evening.     fluocinonide (LIDEX) 0.05 % external solution Apply 1 Application topically at bedtime as needed (scalp irritation).     fluticasone (FLONASE) 50 MCG/ACT nasal spray Place 1 spray into both nostrils daily as needed for allergies. Night and morning     hydroxychloroquine (PLAQUENIL) 200 MG tablet Take 200 mg by mouth 2 (two) times daily.     levothyroxine  (SYNTHROID ,  LEVOTHROID) 50 MCG tablet Take 50 mcg by mouth daily before breakfast.      Magnesium  250 MG TABS Take 250 mg by mouth every evening.     metoprolol  tartrate (LOPRESSOR ) 50 MG tablet Take 1 tablet (50 mg total) by mouth 2 (two) times daily. 180 tablet 3   Multiple Vitamins-Minerals (MULTIVITAMIN WOMEN 50+) TABS Take 1 tablet by mouth every evening.     simvastatin  (ZOCOR ) 40 MG tablet Take 40 mg by mouth every evening.     tacrolimus (PROTOPIC) 0.1 % ointment Apply 1 Application topically daily as needed (scalp irritation).     traZODone (DESYREL) 100 MG tablet Take 50-100 mg by mouth at bedtime as needed for sleep.     No current facility-administered medications for this visit.     Physical Exam: BP 120/68   Pulse 73   Resp 18   Ht 5' 2 (1.575 m)   Wt 145 lb (65.8 kg)   SpO2 98%   BMI 26.52 kg/m  She looks well. Cardiac exam shows regular rate and rhythm with normal heart sounds.  There is no murmur. Lungs are clear. The chest incision is well-healed and the sternum is stable. There is no peripheral edema.  Diagnostic Tests:  CTA of the chest on 03/06/2024 has not been officially read by radiology.  I reviewed the images and her repair appears stable.  The proximal aortic arch  beyond the repair measures about 3.3 cm and the descending thoracic aorta appears normal.  There is no residual dissection flap beyond the repair.  The aortic root appears normal sized.  Impression:  She is doing well status post repair of an acute type A aortic dissection on 09/11/2023.  I reviewed the CTA images with her and answered her questions.  I stressed the importance of continued good blood pressure control in preventing further aortic enlargement and recurrent aortic dissection.  Plan:  I will see her back in 1 year with a CTA of the chest for aortic surveillance.  I spent 15 minutes performing this established patient evaluation and > 50% of this time was spent face to face counseling and  coordinating the care of this patient's previously treated aortic dissection.    Dorise MARLA Fellers, MD [facility] [phone]

## 2024-06-07 ENCOUNTER — Encounter: Payer: Self-pay | Admitting: Cardiology

## 2024-06-24 ENCOUNTER — Other Ambulatory Visit: Payer: Self-pay | Admitting: Family Medicine

## 2024-06-24 DIAGNOSIS — Z1231 Encounter for screening mammogram for malignant neoplasm of breast: Secondary | ICD-10-CM

## 2024-07-03 ENCOUNTER — Ambulatory Visit
Admission: RE | Admit: 2024-07-03 | Discharge: 2024-07-03 | Disposition: A | Source: Ambulatory Visit | Attending: Family Medicine | Admitting: Family Medicine

## 2024-07-03 DIAGNOSIS — Z1231 Encounter for screening mammogram for malignant neoplasm of breast: Secondary | ICD-10-CM | POA: Diagnosis not present

## 2024-07-25 DIAGNOSIS — E039 Hypothyroidism, unspecified: Secondary | ICD-10-CM | POA: Diagnosis not present

## 2024-07-25 DIAGNOSIS — N1831 Chronic kidney disease, stage 3a: Secondary | ICD-10-CM | POA: Diagnosis not present

## 2024-07-25 DIAGNOSIS — E7800 Pure hypercholesterolemia, unspecified: Secondary | ICD-10-CM | POA: Diagnosis not present

## 2024-07-25 DIAGNOSIS — M25512 Pain in left shoulder: Secondary | ICD-10-CM | POA: Diagnosis not present

## 2024-07-25 DIAGNOSIS — G4700 Insomnia, unspecified: Secondary | ICD-10-CM | POA: Diagnosis not present

## 2024-07-25 DIAGNOSIS — G248 Other dystonia: Secondary | ICD-10-CM | POA: Diagnosis not present

## 2024-07-25 LAB — LAB REPORT - SCANNED: EGFR: 61

## 2024-07-26 DIAGNOSIS — L433 Subacute (active) lichen planus: Secondary | ICD-10-CM | POA: Diagnosis not present

## 2024-07-26 DIAGNOSIS — L820 Inflamed seborrheic keratosis: Secondary | ICD-10-CM | POA: Diagnosis not present

## 2024-07-26 DIAGNOSIS — L6611 Classic lichen planopilaris: Secondary | ICD-10-CM | POA: Diagnosis not present

## 2024-07-31 DIAGNOSIS — M25512 Pain in left shoulder: Secondary | ICD-10-CM | POA: Diagnosis not present

## 2024-07-31 DIAGNOSIS — M7582 Other shoulder lesions, left shoulder: Secondary | ICD-10-CM | POA: Diagnosis not present

## 2024-08-19 ENCOUNTER — Encounter: Payer: Self-pay | Admitting: Surgery

## 2024-08-19 DIAGNOSIS — M25512 Pain in left shoulder: Secondary | ICD-10-CM | POA: Diagnosis not present

## 2024-08-28 DIAGNOSIS — M19011 Primary osteoarthritis, right shoulder: Secondary | ICD-10-CM | POA: Diagnosis not present

## 2024-08-28 DIAGNOSIS — M19012 Primary osteoarthritis, left shoulder: Secondary | ICD-10-CM | POA: Diagnosis not present

## 2024-08-28 DIAGNOSIS — M7582 Other shoulder lesions, left shoulder: Secondary | ICD-10-CM | POA: Diagnosis not present

## 2024-08-28 DIAGNOSIS — M25512 Pain in left shoulder: Secondary | ICD-10-CM | POA: Diagnosis not present

## 2024-08-28 DIAGNOSIS — M7552 Bursitis of left shoulder: Secondary | ICD-10-CM | POA: Diagnosis not present

## 2024-08-28 DIAGNOSIS — M5030 Other cervical disc degeneration, unspecified cervical region: Secondary | ICD-10-CM | POA: Diagnosis not present

## 2024-08-28 DIAGNOSIS — M25511 Pain in right shoulder: Secondary | ICD-10-CM | POA: Diagnosis not present

## 2024-08-28 DIAGNOSIS — M542 Cervicalgia: Secondary | ICD-10-CM | POA: Diagnosis not present

## 2024-09-02 NOTE — Assessment & Plan Note (Signed)
 Status post aortic dissection repair in December 2024 with supra-coronary hemashield graft. She had recurrent pleural effusions requiring thoracentesis x 2 after surgery. She is overall stable. I have encouraged her to reach out to Dr. Jeralyn office to arrange follow up. BP is well controlled on current Rx.  - Continue metoprolol  tartrate 50 mg twice daily

## 2024-09-02 NOTE — Progress Notes (Unsigned)
 OFFICE NOTE:    Date:  09/03/2024  ID:  Darlene Burns, DOB 1951-05-19, MRN [id number] PCP: Claudene Pellet, MD  [facility] Providers Cardiologist:  Oneil Parchment, MD        Type A Aortic dissection s/p repair in 08/2023 Supra-coronary replacement with 30 mm Hemashield graft Intraoperative TEE 08/2023: EF 60-65 Repair c/b pleural effusion requiring L thoracentesis Residual effusion - S/p L thoracentesis 10/2023   Coronary artery Ca2+ CT June 2025       Discussed the use of AI scribe software for clinical note transcription with the patient, who gave verbal consent to proceed. History of Present Illness Darlene Burns is a 73 y.o. female for follow up of aortic dissection. She had a Type A aortic dissection and underwent repair in 08/2023. She had recurrent pleural effusions requiring thoracenteses. She was last seen in June 2025.   She is here alone. She continues to experience intermittent swelling in the supraclavicular space on the right side. It has been present since at least June but was not present before her surgery. It is not painful, and she does not feel a pulse in it. She has bilateral shoulder pain. She has seen sports medicine. She had a recent cortisone injection on the left and is able to fully abduct her L arm now.   She reports shortness of breath when climbing stairs or walking uphill. No chest pain, jaw pain, arm pain, or any discomfort during activities, but she notes fatigue and shortness of breath when ascending from her basement. On the day before Thanksgiving, she experienced sweating, weakness, and nausea after carrying heavy loads, which resolved after 30-45 minutes. She had eaten two hours prior to this episode. No chest pressure, jaw pain, or arm pain during this event.  No fever, cough, vomiting, diarrhea, or blood in stool or urine. She does not smoke and has no known allergies to contrast or IV dye.    ROS-See HPI     Studies Reviewed:   EKG Interpretation Date/Time:  Tuesday September 03 2024 09:06:03 EST Ventricular Rate:  66 PR Interval:  148 QRS Duration:  120 QT Interval:  440 QTC Calculation: 461 R Axis:   -54  Text Interpretation: Normal sinus rhythm Left axis deviation Left ventricular hypertrophy with QRS widening Cannot rule out Septal infarct Poor R wave progression Nonspecific ST and T wave abnormality Confirmed by Lelon Hamilton [phone]) on 09/03/2024 9:14:38 AM            Physical Exam:  VS:  BP 118/70 (BP Location: Left Arm, Patient Position: Sitting, Cuff Size: Normal)   Pulse 66   Resp 16   Ht 5' 2 (1.575 m)   Wt 140 lb 3.2 oz (63.6 kg)   SpO2 99%   BMI 25.64 kg/m        Wt Readings from Last 3 Encounters:  09/03/24 140 lb 3.2 oz (63.6 kg)  03/13/24 145 lb (65.8 kg)  02/28/24 145 lb (65.8 kg)    Constitutional:      Appearance: Healthy appearance. Not in distress.  Neck:     Vascular: No JVR. JVD normal.  Pulmonary:     Breath sounds: Normal breath sounds. No wheezing. No rales.  Chest:     Comments: R clavicle appears more prominent than L; no pulsatile mass noted in R supraclavicular space (or bruit) Cardiovascular:     Normal rate. Regular rhythm.     Murmurs: There is no murmur.  Edema:  Peripheral edema absent.  Abdominal:     Palpations: Abdomen is soft.        Assessment and Plan:    Assessment & Plan Dissection of ascending aorta [facility]) Status post aortic dissection repair in December 2024 with supra-coronary hemashield graft. She had recurrent pleural effusions requiring thoracentesis x 2 after surgery. She is overall stable. I have encouraged her to reach out to Dr. Jeralyn office to arrange follow up. BP is well controlled on current Rx.  - Continue metoprolol  tartrate 50 mg twice daily  DOE (dyspnea on exertion) Abnormal EKG Coronary artery calcification She notes shortness of breath with exertion and going up inclines. She had exertional nausea, diaphoresis and  weakness with heavy exertion last month. Question if this could be an anginal equivalent. She has PRWP on EKG which appears different from prior EKGs. The only echocardiogram done was the intra-op echocardiogram with her dissection repair. I have recommended getting a CCTA to rule out obstructive CAD and and echocardiogram to rule out structural heart disease. The CT will image the aorta somewhat as well as the upper chest, which will be beneficial.  - Arrange CCTA - She can take Metoprolol  100 mg on the morning of her CT (instead of her 50 mg dose) - Arrange Echocardiogram  - Continue ASA 81 mg once daily, Simvastatin  40 mg once daily  - If heavy CAC burden or obstructive CAD, will need to make sure LDL < 55 - Follow up 3 mos Abnormal prominence of clavicle Her R clavicle appears more prominent that her L. Question if this is a sequelae of her sternotomy done for the dissection repair in 08/2023. Her last CT in 02/2024 demonstrated stable repair. I have asked her to go ahead and follow up with Dr. Lucas to make sure everything is stable from his standpoint. However, I suspect she may have a subluxed [place] joint and there may not be much that can be done to correct this.          Dispo:  Return in about 3 months (around 12/02/2024) for follow up after testing, w/ Dr. Jeffrie, or Glendia Ferrier, PA-C.  Signed, Glendia Ferrier, PA-C

## 2024-09-03 ENCOUNTER — Encounter: Payer: Self-pay | Admitting: Physician Assistant

## 2024-09-03 ENCOUNTER — Ambulatory Visit: Attending: Physician Assistant | Admitting: Physician Assistant

## 2024-09-03 VITALS — BP 118/70 | HR 66 | Resp 16 | Ht 62.0 in | Wt 140.2 lb

## 2024-09-03 DIAGNOSIS — Q740 Other congenital malformations of upper limb(s), including shoulder girdle: Secondary | ICD-10-CM

## 2024-09-03 DIAGNOSIS — I7101 Dissection of ascending aorta: Secondary | ICD-10-CM

## 2024-09-03 DIAGNOSIS — I2510 Atherosclerotic heart disease of native coronary artery without angina pectoris: Secondary | ICD-10-CM

## 2024-09-03 DIAGNOSIS — R9431 Abnormal electrocardiogram [ECG] [EKG]: Secondary | ICD-10-CM

## 2024-09-03 DIAGNOSIS — R0609 Other forms of dyspnea: Secondary | ICD-10-CM

## 2024-09-03 LAB — BASIC METABOLIC PANEL WITH GFR
BUN/Creatinine Ratio: 16 (ref 12–28)
BUN: 17 mg/dL (ref 8–27)
CO2: 27 mmol/L (ref 20–29)
Calcium: 9.6 mg/dL (ref 8.7–10.3)
Chloride: 101 mmol/L (ref 96–106)
Creatinine, Ser: 1.07 mg/dL — ABNORMAL HIGH (ref 0.57–1.00)
Glucose: 75 mg/dL (ref 70–99)
Potassium: 4.6 mmol/L (ref 3.5–5.2)
Sodium: 142 mmol/L (ref 134–144)
eGFR: 55 mL/min/1.73 — ABNORMAL LOW (ref >59)

## 2024-09-03 NOTE — Patient Instructions (Addendum)
 Medication Instructions:  Your physician recommends that you continue on your current medications as directed. Please refer to the Current Medication list given to you today.  *If you need a refill on your cardiac medications before your next appointment, please call your pharmacy*  Lab Work: BMET-TODAY If you have labs (blood work) drawn today and your tests are completely normal, you will receive your results only by: MyChart Message (if you have MyChart) OR A paper copy in the mail If you have any lab test that is abnormal or we need to change your treatment, we will call you to review the results.  Testing/Procedures: Your physician has requested that you have an echocardiogram. Echocardiography is a painless test that uses sound waves to create images of your heart. It provides your doctor with information about the size and shape of your heart and how well your hearts chambers and valves are working. This procedure takes approximately one hour. There are no restrictions for this procedure. Please do NOT wear cologne, perfume, aftershave, or lotions (deodorant is allowed). Please arrive 15 minutes prior to your appointment time.  Please note: We ask at that you not bring children with you during ultrasound (echo/ vascular) testing. Due to room size and safety concerns, children are not allowed in the ultrasound rooms during exams. Our front office staff cannot provide observation of children in our lobby area while testing is being conducted. An adult accompanying a patient to their appointment will only be allowed in the ultrasound room at the discretion of the ultrasound technician under special circumstances. We apologize for any inconvenience.     Your cardiac CT will be scheduled at one of the below locations:    [facility] [address] [place], [place] [address] [phone]  OR   [facility] [address] [place], [place]  [address] [phone]  OR   [facility] [address]  [place], [place] [address]  OR   [facility] [address] [place], [place] [phone]   If scheduled at the Heart and Vascular Tower at [address], please enter the parking lot using the [address] entrance and use the FREE valet service at the patient drop-off area. Enter the building and check-in with registration on the main floor.  If scheduled at [facility], please arrive to the Heart and Vascular Center 15 mins early for check-in and test prep.  There is spacious parking and easy access to the radiology department from the [facility] entrance. Please enter here and check-in with the desk attendant.   If scheduled at [facility], please arrive 30 minutes early for check-in and test prep.  Please follow these instructions carefully (unless otherwise directed):  An IV will be required for this test and Nitroglycerin  will be given.   On the Night Before the Test: Be sure to Drink plenty of water . Do not consume any caffeinated/decaffeinated beverages or chocolate 12 hours prior to your test. Do not take any antihistamines 12 hours prior to your test.  On the Day of the Test: Drink plenty of water  until 1 hour prior to the test. Do not eat any food 1 hour prior to test. You may take your regular medications prior to the test.  Take metoprolol  (Lopressor ) 2 tablets, (100 mg) two hours prior to test. If you take Furosemide /Hydrochlorothiazide/Spironolactone/Chlorthalidone, please HOLD on the morning of the test. Patients who wear a  continuous glucose monitor MUST remove the device prior to scanning. FEMALES- please wear underwire-free bra if available, avoid dresses & tight clothing  After the Test: Drink plenty of water . After receiving IV contrast, you may experience a mild flushed feeling. This is normal. On occasion, you  may experience a mild rash up to 24 hours after the test. This is not dangerous. If this occurs, you can take Benadryl 25 mg, Zyrtec, Claritin, or Allegra and increase your fluid intake. (Patients taking Tikosyn should avoid Benadryl, and may take Zyrtec, Claritin, or Allegra) If you experience trouble breathing, this can be serious. If it is severe call 911 IMMEDIATELY. If it is mild, please call our office.  We will call to schedule your test 2-4 weeks out understanding that some insurance companies will need an authorization prior to the service being performed.   For more information and frequently asked questions, please visit our website : [email or web address]  For non-scheduling related questions, please contact the cardiac imaging nurse navigator should you have any questions/concerns: Cardiac Imaging Nurse Navigators Direct Office Dial: [phone]   For scheduling needs, including cancellations and rescheduling, please call Brittany, [phone].   Follow-Up: At [facility], you and your health needs are our priority.  As part of our continuing mission to provide you with exceptional heart care, our providers are all part of one team.  This team includes your primary Cardiologist (physician) and Advanced Practice Providers or APPs (Physician Assistants and Nurse Practitioners) who all work together to provide you with the care you need, when you need it.  Your next appointment:   3 month(s)  Provider:   Oneil Parchment, MD or Glendia Ferrier, PA-C

## 2024-09-04 ENCOUNTER — Ambulatory Visit: Payer: Self-pay | Admitting: Physician Assistant

## 2024-09-04 DIAGNOSIS — I2510 Atherosclerotic heart disease of native coronary artery without angina pectoris: Secondary | ICD-10-CM

## 2024-09-04 DIAGNOSIS — E7800 Pure hypercholesterolemia, unspecified: Secondary | ICD-10-CM

## 2024-09-06 ENCOUNTER — Encounter: Payer: Self-pay | Admitting: Surgery

## 2024-09-16 ENCOUNTER — Encounter (HOSPITAL_COMMUNITY): Payer: Self-pay

## 2024-09-18 ENCOUNTER — Other Ambulatory Visit (HOSPITAL_BASED_OUTPATIENT_CLINIC_OR_DEPARTMENT_OTHER): Payer: Self-pay | Admitting: Cardiology

## 2024-09-18 ENCOUNTER — Ambulatory Visit (HOSPITAL_COMMUNITY)
Admission: RE | Admit: 2024-09-18 | Discharge: 2024-09-18 | Disposition: A | Discharge status: Home / Self Care | Source: Ambulatory Visit | Attending: Student in an Organized Health Care Education/Training Program | Admitting: Student in an Organized Health Care Education/Training Program

## 2024-09-18 ENCOUNTER — Ambulatory Visit (HOSPITAL_BASED_OUTPATIENT_CLINIC_OR_DEPARTMENT_OTHER)
Admission: RE | Admit: 2024-09-18 | Discharge: 2024-09-18 | Disposition: A | Discharge status: Home / Self Care | Source: Ambulatory Visit | Attending: Cardiology | Admitting: Cardiology

## 2024-09-18 DIAGNOSIS — R9431 Abnormal electrocardiogram [ECG] [EKG]: Secondary | ICD-10-CM | POA: Diagnosis not present

## 2024-09-18 DIAGNOSIS — R0609 Other forms of dyspnea: Secondary | ICD-10-CM | POA: Insufficient documentation

## 2024-09-18 DIAGNOSIS — R931 Abnormal findings on diagnostic imaging of heart and coronary circulation: Secondary | ICD-10-CM | POA: Diagnosis not present

## 2024-09-18 DIAGNOSIS — I7101 Dissection of ascending aorta: Secondary | ICD-10-CM | POA: Diagnosis not present

## 2024-09-18 MED ORDER — IOHEXOL 350 MG/ML SOLN
100.0000 mL | Freq: Once | INTRAVENOUS | Status: AC | PRN
  Administered 2024-09-18: 100 mL via INTRAVENOUS

## 2024-09-18 MED ORDER — DILTIAZEM HCL 25 MG/5ML IV SOLN: 10.0000 mg | INTRAVENOUS | Status: DC | PRN

## 2024-09-18 MED ORDER — NITROGLYCERIN 0.4 MG SL SUBL
0.8000 mg | SUBLINGUAL_TABLET | Freq: Once | SUBLINGUAL | Status: AC
  Administered 2024-09-18: 0.8 mg via SUBLINGUAL

## 2024-09-18 MED ORDER — METOPROLOL TARTRATE 5 MG/5ML IV SOLN: 10.0000 mg | Freq: Once | INTRAVENOUS | Status: DC | PRN

## 2024-09-19 ENCOUNTER — Encounter: Payer: Self-pay | Admitting: Physician Assistant

## 2024-09-19 ENCOUNTER — Ambulatory Visit: Payer: Self-pay | Admitting: Physician Assistant

## 2024-09-19 DIAGNOSIS — I2510 Atherosclerotic heart disease of native coronary artery without angina pectoris: Secondary | ICD-10-CM | POA: Insufficient documentation

## 2024-09-19 DIAGNOSIS — E7800 Pure hypercholesterolemia, unspecified: Secondary | ICD-10-CM | POA: Insufficient documentation

## 2024-09-25 ENCOUNTER — Telehealth: Payer: Self-pay

## 2024-09-25 MED ORDER — ISOSORBIDE MONONITRATE ER 30 MG PO TB24: 15.0000 mg | ORAL_TABLET | Freq: Every day | ORAL | 3 refills | Status: AC

## 2024-09-25 NOTE — Telephone Encounter (Signed)
 Imdur  sent to pt pharmacy. Patient came in today to have fasting labs.

## 2024-09-26 LAB — LIPID PANEL
Chol/HDL Ratio: 1.9 ratio (ref 0.0–4.4)
Cholesterol, Total: 140 mg/dL (ref 100–199)
HDL: 74 mg/dL
LDL Chol Calc (NIH): 49 mg/dL (ref 0–99)
Triglycerides: 90 mg/dL (ref 0–149)
VLDL Cholesterol Cal: 17 mg/dL (ref 5–40)

## 2024-09-26 LAB — LIPOPROTEIN A (LPA): Lipoprotein (a): 79.1 nmol/L — ABNORMAL HIGH

## 2024-09-26 LAB — ALT: ALT: 24 IU/L (ref 0–32)

## 2024-11-25 ENCOUNTER — Ambulatory Visit (HOSPITAL_COMMUNITY)

## 2024-11-26 ENCOUNTER — Ambulatory Visit: Admitting: Physician Assistant
